# Patient Record
Sex: Female | Born: 1990
Health system: Southern US, Community
[De-identification: ages and names within clinical notes are randomized; demographics above are authoritative.]

## PROBLEM LIST (undated history)

## (undated) DIAGNOSIS — D649 Anemia, unspecified: Secondary | ICD-10-CM

## (undated) DIAGNOSIS — K219 Gastro-esophageal reflux disease without esophagitis: Secondary | ICD-10-CM

## (undated) DIAGNOSIS — F419 Anxiety disorder, unspecified: Secondary | ICD-10-CM

## (undated) DIAGNOSIS — F32A Depression, unspecified: Secondary | ICD-10-CM

## (undated) DIAGNOSIS — Z87442 Personal history of urinary calculi: Secondary | ICD-10-CM

## (undated) DIAGNOSIS — F329 Major depressive disorder, single episode, unspecified: Secondary | ICD-10-CM

## (undated) DIAGNOSIS — G43909 Migraine, unspecified, not intractable, without status migrainosus: Secondary | ICD-10-CM

## (undated) HISTORY — DX: Depression, unspecified: F32.A

## (undated) HISTORY — DX: Anxiety disorder, unspecified: F41.9

---
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
## 2011-10-09 DIAGNOSIS — B009 Herpesviral infection, unspecified: Secondary | ICD-10-CM | POA: Insufficient documentation

## 2012-03-26 ENCOUNTER — Emergency Department (HOSPITAL_COMMUNITY)
Admission: EM | Admit: 2012-03-26 | Discharge: 2012-03-26 | Discharge status: Absent without leave | Payer: Federal, State, Local not specified - PPO | Source: Home / Self Care | Attending: Emergency Medicine | Admitting: Emergency Medicine

## 2012-07-13 ENCOUNTER — Emergency Department (HOSPITAL_COMMUNITY)
Admission: EM | Admit: 2012-07-13 | Discharge: 2012-07-13 | Disposition: A | Discharge status: Home / Self Care | Payer: Federal, State, Local not specified - PPO | Attending: Emergency Medicine | Admitting: Emergency Medicine

## 2012-07-13 ENCOUNTER — Encounter (HOSPITAL_COMMUNITY): Payer: Self-pay | Admitting: Emergency Medicine

## 2012-07-13 DIAGNOSIS — B9789 Other viral agents as the cause of diseases classified elsewhere: Secondary | ICD-10-CM | POA: Insufficient documentation

## 2012-07-13 DIAGNOSIS — R6883 Chills (without fever): Secondary | ICD-10-CM | POA: Insufficient documentation

## 2012-07-13 DIAGNOSIS — R300 Dysuria: Secondary | ICD-10-CM | POA: Insufficient documentation

## 2012-07-13 DIAGNOSIS — R059 Cough, unspecified: Secondary | ICD-10-CM | POA: Insufficient documentation

## 2012-07-13 DIAGNOSIS — R05 Cough: Secondary | ICD-10-CM | POA: Insufficient documentation

## 2012-07-13 DIAGNOSIS — B349 Viral infection, unspecified: Secondary | ICD-10-CM

## 2012-07-13 DIAGNOSIS — H9209 Otalgia, unspecified ear: Secondary | ICD-10-CM | POA: Insufficient documentation

## 2012-07-13 DIAGNOSIS — R61 Generalized hyperhidrosis: Secondary | ICD-10-CM | POA: Insufficient documentation

## 2012-07-13 DIAGNOSIS — J029 Acute pharyngitis, unspecified: Secondary | ICD-10-CM

## 2012-07-13 DIAGNOSIS — J3489 Other specified disorders of nose and nasal sinuses: Secondary | ICD-10-CM | POA: Insufficient documentation

## 2012-07-13 DIAGNOSIS — R1310 Dysphagia, unspecified: Secondary | ICD-10-CM | POA: Insufficient documentation

## 2012-07-13 DIAGNOSIS — J069 Acute upper respiratory infection, unspecified: Secondary | ICD-10-CM | POA: Insufficient documentation

## 2012-07-13 DIAGNOSIS — R070 Pain in throat: Secondary | ICD-10-CM | POA: Insufficient documentation

## 2012-07-13 MED ORDER — DYCLONINE HCL 3 MG MT LOZG: 1.0000 | LOZENGE | OROMUCOSAL | Status: DC | PRN

## 2012-07-13 MED ORDER — BENZOCAINE-MENTHOL 15-10 MG MT LOZG: 1.0000 | LOZENGE | OROMUCOSAL | Status: DC | PRN

## 2012-07-13 MED ORDER — IBUPROFEN 800 MG PO TABS: 800.0000 mg | ORAL_TABLET | Freq: Three times a day (TID) | ORAL | Status: DC

## 2012-07-13 MED ORDER — HYDROCODONE-ACETAMINOPHEN 5-325 MG PO TABS: 1.0000 | ORAL_TABLET | Freq: Four times a day (QID) | ORAL | Status: DC | PRN

## 2012-07-13 MED ORDER — NAPROXEN 250 MG PO TABS
500.0000 mg | ORAL_TABLET | Freq: Once | ORAL | Status: AC
  Administered 2012-07-13: 500 mg via ORAL
  Filled 2012-07-13: qty 2

## 2012-07-13 MED ORDER — HYDROCODONE-ACETAMINOPHEN 5-325 MG PO TABS
2.0000 | ORAL_TABLET | Freq: Once | ORAL | Status: AC
  Administered 2012-07-13: 2 via ORAL
  Filled 2012-07-13: qty 2

## 2012-07-13 MED ORDER — DEXAMETHASONE 2 MG PO TABS
10.0000 mg | ORAL_TABLET | Freq: Once | ORAL | Status: AC
  Administered 2012-07-13: 10 mg via ORAL
  Filled 2012-07-13: qty 5

## 2012-07-13 NOTE — ED Provider Notes (Signed)
History   This chart was scribed for Jones Skene, MD by Melba Coon, ED Scribe. The patient was seen in room TR08C/TR08C and the patient's care was started at 7:29PM.    CSN: [id number]  Arrival date & time 07/13/12  1904   None     Chief Complaint  Patient presents with  . Sore Throat    (Consider location/radiation/quality/duration/timing/severity/associated sxs/prior treatment) The history is provided by the patient. No language interpreter was used.   Michaela Crane is a 21 y.o. female who presents to the Emergency Department complaining of persistent, moderate to severe sore throat with associated fever and difficulty and pain swallowing with an onset 4 days ago. She reprots that her left throat started to hurt first but spread to her entire throat; she reports it hurts worse on her left than her right. She has difficulty swallowing fluids, including her saliva. Ibuprofen and tylenol have slightly alleviated the symptoms but she reports that these meds are not working anymore. She reports she was here at the ED last night and symptoms have gotten progressively worse; no abx were given last night. She reports night sweats, chills, bilateral otalgia, nasal congestion, rhinorrhea, and cough that has alleviated over time. She denies HA, neck pain, rash, back pain, CP, SOB, abdominal pain, nausea, emesis, diarrhea, dysuria, or extremity pain, edema, weakness, numbness, or tingling. She has a history of strep throat in the past. No known allergies. No other pertinent medical symptoms.  History reviewed. No pertinent past medical history.  History reviewed. No pertinent past surgical history.  No family history on file.  History  Substance Use Topics  . Smoking status: Never Smoker   . Smokeless tobacco: Not on file  . Alcohol Use: Yes     Comment: social, once a month    OB History    Grav Para Term Preterm Abortions TAB SAB Ect Mult Living                  Review of  Systems 10 Systems reviewed and are negative for acute change except as noted in the HPI.  Allergies  Review of patient's allergies indicates no known allergies.  Home Medications   Current Outpatient Rx  Name  Route  Sig  Dispense  Refill  . IBUPROFEN 200 MG PO TABS   Oral   Take 1,000 mg by mouth every 8 (eight) hours as needed. For pain           BP 117/69  Pulse 83  Temp 100 F (37.8 C) (Oral)  Resp 20  SpO2 97%  Physical Exam  Nursing notes reviewed.  Electronic medical record reviewed. VITAL SIGNS:   Filed Vitals:   07/13/12 1910  BP: 117/69  Pulse: 83  Temp: 100 F (37.8 C)  TempSrc: Oral  Resp: 20  SpO2: 97%   CONSTITUTIONAL: Awake, oriented, appears non-toxic HENT: Atraumatic, normocephalic, oral mucosa pink and moist, airway patent. Nares patent, turbinates erythematous and swollen with clear nasal drainage. Pharynx shows 3+ tonsils with a gray exudate and copious amounts of mucus in the pharynx draining from the nasopharynx. External ears normal. EYES: Conjunctiva clear, EOMI, PERRLA NECK: Trachea midline, tender cervical lymphadenopathy worse on the left, supple CARDIOVASCULAR: Normal heart rate, Normal rhythm, No murmurs, rubs, gallops PULMONARY/CHEST: Clear to auscultation, no rhonchi, wheezes, or rales. Symmetrical breath sounds. Non-tender. ABDOMINAL: Non-distended, soft, morbidly obese, non-tender - no rebound or guarding.  BS normal. NEUROLOGIC: Non-focal, moving all four extremities, no gross sensory or motor  deficits. EXTREMITIES: No clubbing, cyanosis, or edema SKIN: Warm, Dry, No erythema, No rash  ED Course  Procedures (including critical care time)  COORDINATION OF CARE:  7:37PM - rapid strep screen, pain meds, and cetacaine spray will be ordered for Michaela Crane.     Labs Reviewed  RAPID STREP SCREEN   No results found.   1. Viral pharyngitis   2. Viral syndrome   3. Throat pain   4. Pain with swallowing       MDM   Michaela Crane is a 21 y.o. female presenting with sore throat difficulty swallowing which has gotten worse-she seen in the ER yesterday and diagnosed with viral illness. She had a rapid strep yesterday and a rapid strep today, certainly has an impressive case of viral pharyngitis.  Given the patient some pain medicine including Vicodin and Aleve and is having a much easier time swallowing. Treat the patient with 10 mg of Decadron and send her home with same pain medicine. I still do not think that this is a bacterial source, there is no evidence for peritonsillar abscess or retropharyngeal abscess-I. do not feel that imaging would add anything to the diagnosis at this time.  I explained the diagnosis and have given explicit precautions to return to the ER including any other new or worsening symptoms. The patient understands and accepts the medical plan as it's been dictated and I have answered their questions. Discharge instructions concerning home care and prescriptions have been given.  The patient is STABLE and is discharged to home in good condition.   I personally performed the services described in this documentation, which was scribed in my presence. The recorded information has been reviewed and is accurate.         Jones Skene, MD 07/13/12 2118

## 2012-07-13 NOTE — ED Provider Notes (Signed)
History     CSN: [id number]  Arrival date & time 07/13/12  0303   First MD Initiated Contact with Patient 07/13/12 [phone]      Chief Complaint  Patient presents with  . Sore Throat    (Consider location/radiation/quality/duration/timing/severity/associated sxs/prior treatment) Patient is a 21 y.o. female presenting with pharyngitis. The history is provided by the patient.  Sore Throat Pertinent negatives include no chest pain, no abdominal pain, no headaches and no shortness of breath.   Patient is a 21 year old female with complaint of sore throat for 3 days associated with nonproductive cough congestion upper respiratory type symptoms. No history of strep throat in the past. Denies fever.  Patient treated herself with Motrin and Tylenol cold and flu.patient states his sore throat is 8/10 painful to swallow. History reviewed. No pertinent past medical history.  History reviewed. No pertinent past surgical history.  No family history on file.  History  Substance Use Topics  . Smoking status: Never Smoker   . Smokeless tobacco: Not on file  . Alcohol Use: Yes     Comment: social, once a month    OB History    Grav Para Term Preterm Abortions TAB SAB Ect Mult Living                  Review of Systems  Constitutional: Negative for fever.  HENT: Positive for congestion and sore throat.   Respiratory: Positive for cough. Negative for shortness of breath.   Cardiovascular: Negative for chest pain.  Gastrointestinal: Negative for abdominal pain.  Genitourinary: Positive for dysuria.  Musculoskeletal: Negative for back pain.  Skin: Negative for rash.  Neurological: Negative for headaches.  Hematological: Does not bruise/bleed easily.    Allergies  Review of patient's allergies indicates no known allergies.  Home Medications   Current Outpatient Rx  Name  Route  Sig  Dispense  Refill  . IBUPROFEN 200 MG PO TABS   Oral   Take 1,000 mg by mouth every 8 (eight) hours  as needed. For pain         . TYLENOL COLD MULTI-SYMPTOM PO   Oral   Take 2 tablets by mouth every 6 (six) hours as needed. For cold relief         . IBUPROFEN 800 MG PO TABS   Oral   Take 1 tablet (800 mg total) by mouth 3 (three) times daily.   21 tablet   0     BP 113/72  Pulse 101  Temp 99.2 F (37.3 C) (Oral)  Resp 18  SpO2 98%  Physical Exam  Nursing note and vitals reviewed. Constitutional: She is oriented to person, place, and time. She appears well-developed and well-nourished. No distress.  HENT:  Head: Normocephalic and atraumatic.  Mouth/Throat: Oropharynx is clear and moist.       Tonsils slightly enlarged no exudate erythematous  Eyes: Conjunctivae normal and EOM are normal. Pupils are equal, round, and reactive to light.  Neck: Normal range of motion. Neck supple.  Cardiovascular: Normal rate, regular rhythm and normal heart sounds.   No murmur heard. Pulmonary/Chest: Effort normal and breath sounds normal. No respiratory distress. She has no wheezes.  Abdominal: Soft. Bowel sounds are normal. There is no tenderness.  Musculoskeletal: Normal range of motion. She exhibits no tenderness.  Lymphadenopathy:    She has no cervical adenopathy.  Neurological: She is alert and oriented to person, place, and time. No cranial nerve deficit. She exhibits normal muscle tone. Coordination normal.  Skin: Skin is warm. No rash noted.    ED Course  Procedures (including critical care time)   Labs Reviewed  RAPID STREP SCREEN   No results found.   1. Upper respiratory infection   2. Pharyngitis       MDM   Symptoms and clinical findings are consistent with a viral upper respiratory infection with pharyngitis. strep test is negative.  No symptoms consistent with pneumonia.        Shelda Jakes, MD 07/13/12 [phone]

## 2012-07-13 NOTE — ED Notes (Signed)
C/o sore throat and difficulty swallowing since Thursday.  Pt states she was seen in ED yesterday and is worse today.  Pt spitting saliva out and states she cannot swallow it.

## 2012-07-13 NOTE — ED Notes (Signed)
Pt is unable to swallow. C/O pain in ears, chills, and headaches. Symptoms started Thursday. Pt has been taking tylenol cold and flu and Motrin.

## 2013-02-28 ENCOUNTER — Encounter (HOSPITAL_COMMUNITY): Payer: Self-pay | Admitting: Emergency Medicine

## 2013-02-28 ENCOUNTER — Emergency Department (HOSPITAL_COMMUNITY)
Admission: EM | Admit: 2013-02-28 | Discharge: 2013-03-01 | Disposition: A | Discharge status: Home / Self Care | Payer: Federal, State, Local not specified - PPO | Attending: Emergency Medicine | Admitting: Emergency Medicine

## 2013-02-28 DIAGNOSIS — R109 Unspecified abdominal pain: Secondary | ICD-10-CM | POA: Insufficient documentation

## 2013-02-28 DIAGNOSIS — K529 Noninfective gastroenteritis and colitis, unspecified: Secondary | ICD-10-CM

## 2013-02-28 DIAGNOSIS — Z3202 Encounter for pregnancy test, result negative: Secondary | ICD-10-CM | POA: Insufficient documentation

## 2013-02-28 DIAGNOSIS — K5289 Other specified noninfective gastroenteritis and colitis: Secondary | ICD-10-CM | POA: Insufficient documentation

## 2013-02-28 DIAGNOSIS — R112 Nausea with vomiting, unspecified: Secondary | ICD-10-CM | POA: Insufficient documentation

## 2013-02-28 LAB — CG4 I-STAT (LACTIC ACID): Lactic Acid, Venous: 0.95 mmol/L (ref 0.5–2.2)

## 2013-02-28 LAB — CBC WITH DIFFERENTIAL/PLATELET
Eosinophils Relative: 0 % (ref 0–5)
HCT: 38.9 % (ref 36.0–46.0)
Lymphocytes Relative: 10 % — ABNORMAL LOW (ref 12–46)
Lymphs Abs: 1.9 10*3/uL (ref 0.7–4.0)
MCV: 85.3 fL (ref 78.0–100.0)
Monocytes Absolute: 1.1 10*3/uL — ABNORMAL HIGH (ref 0.1–1.0)
RBC: 4.56 MIL/uL (ref 3.87–5.11)
WBC: 18.5 10*3/uL — ABNORMAL HIGH (ref 4.0–10.5)

## 2013-02-28 LAB — COMPREHENSIVE METABOLIC PANEL
ALT: 11 U/L (ref 0–35)
CO2: 26 mEq/L (ref 19–32)
Calcium: 9.3 mg/dL (ref 8.4–10.5)
Creatinine, Ser: 0.67 mg/dL (ref 0.50–1.10)
GFR calc Af Amer: >90 mL/min (ref >90)
GFR calc non Af Amer: >90 mL/min (ref >90)
Glucose, Bld: 99 mg/dL (ref 70–99)

## 2013-02-28 LAB — URINALYSIS, ROUTINE W REFLEX MICROSCOPIC
Bilirubin Urine: NEGATIVE
Nitrite: NEGATIVE
Protein, ur: 30 mg/dL — AB
Specific Gravity, Urine: 1.028 (ref 1.005–1.030)
Urobilinogen, UA: 0.2 mg/dL (ref 0.0–1.0)

## 2013-02-28 LAB — URINE MICROSCOPIC-ADD ON

## 2013-02-28 MED ORDER — ONDANSETRON HCL 4 MG/2ML IJ SOLN
4.0000 mg | Freq: Once | INTRAMUSCULAR | Status: AC
  Administered 2013-02-28: 4 mg via INTRAVENOUS
  Filled 2013-02-28: qty 2

## 2013-02-28 MED ORDER — SODIUM CHLORIDE 0.9 % IV BOLUS (SEPSIS)
1000.0000 mL | Freq: Once | INTRAVENOUS | Status: AC
  Administered 2013-02-28: 1000 mL via INTRAVENOUS

## 2013-02-28 NOTE — ED Provider Notes (Signed)
CSN: [id number]     Arrival date & time 02/28/13  2121 History     First MD Initiated Contact with Patient 02/28/13 2245     Chief Complaint  Patient presents with  . Emesis  . Diarrhea   (Consider location/radiation/quality/duration/timing/severity/associated sxs/prior Treatment) HPI  The patient is a 22 year old female presented to the emergency department for one day of nausea, nonbilious nonbloody vomiting, diarrhea with occasional mild cramping abdominal pain. Patient states she's not currently having any pain at this time. She denies any alleviating or aggravating factors. She denies any sick contacts or history of abdominal surgeries. LMP was 1 week ago. Patient denies any fevers, chills, dysuria, vaginal discharge or bleeding, cough.  History reviewed. No pertinent past medical history. History reviewed. No pertinent past surgical history. No family history on file. History  Substance Use Topics  . Smoking status: Never Smoker   . Smokeless tobacco: Not on file  . Alcohol Use: Yes     Comment: social, once a month   OB History   Grav Para Term Preterm Abortions TAB SAB Ect Mult Living                 Review of Systems  Constitutional: Negative for fever and chills.  Gastrointestinal: Positive for nausea, vomiting, abdominal pain and diarrhea. Negative for blood in stool, abdominal distention and anal bleeding.  All other systems reviewed and are negative.    Allergies  Review of patient's allergies indicates no known allergies.  Home Medications   Current Outpatient Rx  Name  Route  Sig  Dispense  Refill  . ibuprofen (ADVIL,MOTRIN) 200 MG tablet   Oral   Take 400 mg by mouth every 6 (six) hours as needed for pain.         Marland Kitchen ondansetron (ZOFRAN) 4 MG tablet   Oral   Take 1 tablet (4 mg total) by mouth every 6 (six) hours.   12 tablet   0    BP 120/57  Pulse 76  Temp(Src) 99.6 F (37.6 C) (Oral)  Resp 18  SpO2 97%  LMP 02/20/2013 Physical Exam   Constitutional: She is oriented to person, place, and time. She appears well-developed and well-nourished.  HENT:  Head: Normocephalic and atraumatic.  Mouth/Throat: Oropharynx is clear and moist.  Eyes: Conjunctivae are normal.  Neck: Neck supple.  Cardiovascular: Normal rate, regular rhythm and normal heart sounds.   Pulmonary/Chest: Effort normal and breath sounds normal.  Abdominal: Soft. Bowel sounds are normal. She exhibits no distension. There is no tenderness. There is no rigidity, no rebound, no guarding, no CVA tenderness, no tenderness at McBurney's point and negative Murphy's sign.  Musculoskeletal: Normal range of motion.  Neurological: She is alert and oriented to person, place, and time.  Skin: Skin is warm and dry.    ED Course   Procedures (including critical care time)  Labs Reviewed  CBC WITH DIFFERENTIAL - Abnormal; Notable for the following:    WBC 18.5 (*)    Neutrophils Relative % 84 (*)    Neutro Abs 15.4 (*)    Lymphocytes Relative 10 (*)    Monocytes Absolute 1.1 (*)    All other components within normal limits  COMPREHENSIVE METABOLIC PANEL - Abnormal; Notable for the following:    Total Bilirubin 0.2 (*)    All other components within normal limits  URINALYSIS, ROUTINE W REFLEX MICROSCOPIC - Abnormal; Notable for the following:    Protein, ur 30 (*)    All other  components within normal limits  URINE MICROSCOPIC-ADD ON - Abnormal; Notable for the following:    Squamous Epithelial / LPF FEW (*)    Bacteria, UA FEW (*)    All other components within normal limits  URINE CULTURE  POCT PREGNANCY, URINE  CG4 I-STAT (LACTIC ACID)   No results found. 1. Gastroenteritis     MDM  Patient with symptoms consistent with viral gastroenteritis. Lungs are clear. Abdomen is soft, non-tender, non-distended.  No focal abdominal pain, no concern for appendicitis, cholecystitis, pancreatitis, ruptured viscus, UTI, kidney stone, or any other abdominal etiology.   Vitals are stable, no fever.  Leukocytosis most likely d/t stress. No signs of dehydration. IVF and nausea medication provided. Pt symptoms improved in ED.  Supportive therapy indicated with return if symptoms worsen.  Patient counseled. Patient is agreeable to plan. Patient d/w with Dr. Norlene Campbell, agrees with plan. Patient is stable at time of discharge      Jeannetta Ellis, PA-C 03/01/13 5621

## 2013-02-28 NOTE — ED Notes (Signed)
PT. REPORTS EMESIS WITH DIARRHEA AND LOW ABDOMINAL PAIN ONSET THIS MORNING , DENIES FEVER OR CHILL S, NO DYSURIA OR VAGINAL DISCHARGE.

## 2013-02-28 NOTE — ED Notes (Signed)
Pt reporting 6/10 lower abdominal pain that started this morning. Pt concerned that she could be pregnant. Denies dysuria, vaginal discharge.

## 2013-03-01 MED ORDER — ONDANSETRON HCL 4 MG PO TABS: 4.0000 mg | ORAL_TABLET | Freq: Four times a day (QID) | ORAL | Status: DC

## 2013-03-01 NOTE — ED Provider Notes (Signed)
Medical screening examination/treatment/procedure(s) were performed by non-physician practitioner and as supervising physician I was immediately available for consultation/collaboration.  Blong Busk M Orvella Digiulio, MD 03/01/13 0713 

## 2013-03-02 LAB — URINE CULTURE

## 2014-05-24 ENCOUNTER — Emergency Department (HOSPITAL_BASED_OUTPATIENT_CLINIC_OR_DEPARTMENT_OTHER): Payer: Federal, State, Local not specified - PPO

## 2014-05-24 ENCOUNTER — Encounter (HOSPITAL_BASED_OUTPATIENT_CLINIC_OR_DEPARTMENT_OTHER): Payer: Self-pay | Admitting: Emergency Medicine

## 2014-05-24 ENCOUNTER — Emergency Department (HOSPITAL_BASED_OUTPATIENT_CLINIC_OR_DEPARTMENT_OTHER)
Admission: EM | Admit: 2014-05-24 | Discharge: 2014-05-24 | Disposition: A | Discharge status: Home / Self Care | Payer: Federal, State, Local not specified - PPO | Attending: Emergency Medicine | Admitting: Emergency Medicine

## 2014-05-24 DIAGNOSIS — Y9389 Activity, other specified: Secondary | ICD-10-CM | POA: Insufficient documentation

## 2014-05-24 DIAGNOSIS — S93602A Unspecified sprain of left foot, initial encounter: Secondary | ICD-10-CM

## 2014-05-24 DIAGNOSIS — T1490XA Injury, unspecified, initial encounter: Secondary | ICD-10-CM

## 2014-05-24 DIAGNOSIS — Y929 Unspecified place or not applicable: Secondary | ICD-10-CM | POA: Diagnosis not present

## 2014-05-24 DIAGNOSIS — W1839XA Other fall on same level, initial encounter: Secondary | ICD-10-CM | POA: Diagnosis not present

## 2014-05-24 DIAGNOSIS — Z791 Long term (current) use of non-steroidal anti-inflammatories (NSAID): Secondary | ICD-10-CM | POA: Insufficient documentation

## 2014-05-24 DIAGNOSIS — S99922A Unspecified injury of left foot, initial encounter: Secondary | ICD-10-CM | POA: Diagnosis present

## 2014-05-24 NOTE — ED Notes (Signed)
Left foot injury from a fall 1 week ago

## 2014-05-24 NOTE — Discharge Instructions (Signed)
Foot Sprain The muscles and cord like structures which attach muscle to bone (tendons) that surround the feet are made up of units. A foot sprain can occur at the weakest spot in any of these units. This condition is most often caused by injury to or overuse of the foot, as from playing contact sports, or aggravating a previous injury, or from poor conditioning, or obesity. SYMPTOMS  Pain with movement of the foot.  Tenderness and swelling at the injury site.  Loss of strength is present in moderate or severe sprains. THE THREE GRADES OR SEVERITY OF FOOT SPRAIN ARE:  Mild (Grade I): Slightly pulled muscle without tearing of muscle or tendon fibers or loss of strength.  Moderate (Grade II): Tearing of fibers in a muscle, tendon, or at the attachment to bone, with small decrease in strength.  Severe (Grade III): Rupture of the muscle-tendon-bone attachment, with separation of fibers. Severe sprain requires surgical repair. Often repeating (chronic) sprains are caused by overuse. Sudden (acute) sprains are caused by direct injury or over-use. DIAGNOSIS  Diagnosis of this condition is usually by your own observation. If problems continue, a caregiver may be required for further evaluation and treatment. X-rays may be required to make sure there are not breaks in the bones (fractures) present. Continued problems may require physical therapy for treatment. PREVENTION  Use strength and conditioning exercises appropriate for your sport.  Warm up properly prior to working out.  Use athletic shoes that are made for the sport you are participating in.  Allow adequate time for healing. Early return to activities makes repeat injury more likely, and can lead to an unstable arthritic foot that can result in prolonged disability. Mild sprains generally heal in 3 to 10 days, with moderate and severe sprains taking 2 to 10 weeks. Your caregiver can help you determine the proper time required for  healing. HOME CARE INSTRUCTIONS   Apply ice to the injury for 15-20 minutes, 03-04 times per day. Put the ice in a plastic bag and place a towel between the bag of ice and your skin.  An elastic wrap (like an Ace bandage) may be used to keep swelling down.  Keep foot above the level of the heart, or at least raised on a footstool, when swelling and pain are present.  Try to avoid use other than gentle range of motion while the foot is painful. Do not resume use until instructed by your caregiver. Then begin use gradually, not increasing use to the point of pain. If pain does develop, decrease use and continue the above measures, gradually increasing activities that do not cause discomfort, until you gradually achieve normal use.  Use crutches if and as instructed, and for the length of time instructed.  Keep injured foot and ankle wrapped between treatments.  Massage foot and ankle for comfort and to keep swelling down. Massage from the toes up towards the knee.  Only take over-the-counter or prescription medicines for pain, discomfort, or fever as directed by your caregiver. SEEK IMMEDIATE MEDICAL CARE IF:   Your pain and swelling increase, or pain is not controlled with medications.  You have loss of feeling in your foot or your foot turns cold or blue.  You develop new, unexplained symptoms, or an increase of the symptoms that brought you to your caregiver. MAKE SURE YOU:   Understand these instructions.  Will watch your condition.  Will get help right away if you are not doing well or get worse. Document Released:   01/12/2002 Document Revised: 10/15/2011 Document Reviewed: 03/11/2008 Exit[employer] Patient Information 2015 [employer]. This information is not intended to replace advice given to you by your health care provider. Make sure you discuss any questions you have with your health care provider.  

## 2014-05-24 NOTE — ED Provider Notes (Signed)
CSN: [id number]     Arrival date & time 05/24/14  1531 History   First MD Initiated Contact with Patient 05/24/14 1714     Chief Complaint  Patient presents with  . Foot Injury     (Consider location/radiation/quality/duration/timing/severity/associated sxs/prior Treatment) HPI Comments: Pt states that she just wants to make sure that it isn't broken  Patient is a 23 y.o. female presenting with foot injury. The history is provided by the patient. No language interpreter was used.  Foot Injury Location:  Foot Time since incident:  1 week Injury: yes   Mechanism of injury: fall   Fall:    Fall occurred:  Standing   Impact surface:  Hard floor   Entrapped after fall: no   Foot location:  L foot Pain details:    Quality:  Aching   Radiates to:  Does not radiate   Severity:  Mild   Onset quality:  Sudden   Timing:  Constant   Progression:  Waxing and waning   History reviewed. No pertinent past medical history. History reviewed. No pertinent past surgical history. No family history on file. History  Substance Use Topics  . Smoking status: Never Smoker   . Smokeless tobacco: Not on file  . Alcohol Use: Yes   OB History   Grav Para Term Preterm Abortions TAB SAB Ect Mult Living                 Review of Systems  All other systems reviewed and are negative.     Allergies  Review of patient's allergies indicates no known allergies.  Home Medications   Prior to Admission medications   Medication Sig Start Date End Date Taking? Authorizing Provider  ibuprofen (ADVIL,MOTRIN) 200 MG tablet Take 400 mg by mouth every 6 (six) hours as needed for pain.    Historical Provider, MD  ondansetron (ZOFRAN) 4 MG tablet Take 1 tablet (4 mg total) by mouth every 6 (six) hours. 03/01/13   Jennifer L Piepenbrink, PA-C   BP 126/64  Pulse 62  Temp(Src) 98.6 F (37 C) (Oral)  Resp 16  Ht 5\' 2"  (1.575 m)  Wt 220 lb (99.791 kg)  BMI 40.23 kg/m2  SpO2 99%  LMP  05/16/2014 Physical Exam  Nursing note and vitals reviewed. Constitutional: She is oriented to person, place, and time. She appears well-developed and well-nourished.  Cardiovascular: Normal rate and regular rhythm.   Pulmonary/Chest: Effort normal and breath sounds normal.  Musculoskeletal:  Tender along the fourth and fifth metatarsal. No deformity or swelling. Pulses intact.   Neurological: She is alert and oriented to person, place, and time.    ED Course  Procedures (including critical care time) Labs Review Labs Reviewed - No data to display  Imaging Review Dg Foot Complete Left  05/24/2014   CLINICAL DATA:  Left lateral foot pain after fall.  EXAM: LEFT FOOT - COMPLETE 3+ VIEW  COMPARISON:  None.  FINDINGS: There is no evidence of fracture or dislocation. There is no evidence of arthropathy or other focal bone abnormality. Soft tissues are unremarkable.  IMPRESSION: Normal left foot.   Electronically Signed   By: Roque LiasJames  Green M.D.   On: 05/24/2014 16:22     EKG Interpretation None      MDM   Final diagnoses:  Foot sprain, left, initial encounter    No acute bony abnormality needed. Pt given ortho follow up as needed. Discussed stress fractures    Teressa LowerVrinda Vinny Taranto, NP 05/24/14 1733

## 2014-05-26 NOTE — ED Provider Notes (Signed)
Medical screening examination/treatment/procedure(s) were performed by non-physician practitioner and as supervising physician I was immediately available for consultation/collaboration.   EKG Interpretation None        Kendrea Cerritos, MD 05/26/14 0010 

## 2014-08-12 ENCOUNTER — Emergency Department (HOSPITAL_BASED_OUTPATIENT_CLINIC_OR_DEPARTMENT_OTHER)
Admission: EM | Admit: 2014-08-12 | Discharge: 2014-08-12 | Disposition: A | Discharge status: Home / Self Care | Payer: Federal, State, Local not specified - PPO | Attending: Emergency Medicine | Admitting: Emergency Medicine

## 2014-08-12 ENCOUNTER — Encounter (HOSPITAL_BASED_OUTPATIENT_CLINIC_OR_DEPARTMENT_OTHER): Payer: Self-pay | Admitting: Family Medicine

## 2014-08-12 DIAGNOSIS — J029 Acute pharyngitis, unspecified: Secondary | ICD-10-CM | POA: Diagnosis present

## 2014-08-12 DIAGNOSIS — J0100 Acute maxillary sinusitis, unspecified: Secondary | ICD-10-CM

## 2014-08-12 LAB — RAPID STREP SCREEN (MED CTR MEBANE ONLY): STREPTOCOCCUS, GROUP A SCREEN (DIRECT): NEGATIVE

## 2014-08-12 MED ORDER — BENZONATATE 100 MG PO CAPS: 100.0000 mg | ORAL_CAPSULE | Freq: Three times a day (TID) | ORAL | Status: DC

## 2014-08-12 MED ORDER — AMOXICILLIN-POT CLAVULANATE 875-125 MG PO TABS: 1.0000 | ORAL_TABLET | Freq: Two times a day (BID) | ORAL | Status: DC

## 2014-08-12 NOTE — ED Provider Notes (Signed)
CSN: [id number]     Arrival date & time 08/12/14  1605 History   First MD Initiated Contact with Patient 08/12/14 1809     Chief Complaint  Patient presents with  . Sore Throat  . Generalized Body Aches     (Consider location/radiation/quality/duration/timing/severity/associated sxs/prior Treatment) Patient is a 24 y.o. female presenting with pharyngitis. The history is provided by the patient. No language interpreter was used.  Sore Throat This is a new problem. The current episode started in the past 7 days. The problem occurs constantly. The problem has been gradually worsening. Associated symptoms include coughing and a sore throat. Nothing aggravates the symptoms. She has tried nothing for the symptoms. The treatment provided moderate relief.  Pt complains of a cough and sore throat.  Pt reports she has been sick with the same 3 times in 6 weeks.  Pt reports symptoms keep coming back  History reviewed. No pertinent past medical history. History reviewed. No pertinent past surgical history. No family history on file. History  Substance Use Topics  . Smoking status: Never Smoker   . Smokeless tobacco: Not on file  . Alcohol Use: Yes   OB History    No data available     Review of Systems  HENT: Positive for sore throat.   Respiratory: Positive for cough.   All other systems reviewed and are negative.     Allergies  Review of patient's allergies indicates no known allergies.  Home Medications   Prior to Admission medications   Medication Sig Start Date End Date Taking? Authorizing Provider  amoxicillin-clavulanate (AUGMENTIN) 875-125 MG per tablet Take 1 tablet by mouth every 12 (twelve) hours. 08/12/14   Elson Areas, PA-C  benzonatate (TESSALON) 100 MG capsule Take 1 capsule (100 mg total) by mouth every 8 (eight) hours. 08/12/14   Elson Areas, PA-C  ibuprofen (ADVIL,MOTRIN) 200 MG tablet Take 400 mg by mouth every 6 (six) hours as needed for pain.    Historical  Provider, MD  ondansetron (ZOFRAN) 4 MG tablet Take 1 tablet (4 mg total) by mouth every 6 (six) hours. 03/01/13   Jennifer L Piepenbrink, PA-C   BP 120/56 mmHg  Pulse 78  Temp(Src) 97.6 F (36.4 C) (Oral)  Resp 18  Ht  (1.575 m)  Wt 220 lb (99.791 kg)  BMI 40.23 kg/m2  SpO2 100%  LMP 08/02/2014 Physical Exam  Constitutional: She is oriented to person, place, and time. She appears well-developed and well-nourished.  HENT:  Head: Normocephalic and atraumatic.  Right Ear: External ear normal.  Erythema throa  Eyes: EOM are normal. Pupils are equal, round, and reactive to light.  Neck: Normal range of motion.  Cardiovascular: Normal rate and normal heart sounds.   Pulmonary/Chest: Effort normal.  Abdominal: Soft. She exhibits no distension.  Musculoskeletal: Normal range of motion.  Neurological: She is alert and oriented to person, place, and time.  Skin: Skin is warm.  Psychiatric: She has a normal mood and affect.  Nursing note reviewed.   ED Course  Procedures (including critical care time) Labs Review Labs Reviewed  RAPID STREP SCREEN  CULTURE, GROUP A STREP    Imaging Review No results found.   EKG Interpretation None      MDM   Final diagnoses:  Acute maxillary sinusitis, recurrence not specified    Pt counseled on symptoms and viral vs bacterial etiology.   Pt request antibiotic treatment.  I will give tessalon for cough    Lonia Skinner  Joylene GrapesSofia, PA-C 08/12/14 1854  Gerhard Munchobert Lockwood, MD 08/12/14 2337

## 2014-08-12 NOTE — ED Notes (Addendum)
Pt c/o sore throat, cough and generalized body aches x 6 days. Denies fever, n/v/d. Pt sts she has been sick 3x in past few weeks.

## 2014-08-12 NOTE — Discharge Instructions (Signed)

## 2014-08-14 LAB — CULTURE, GROUP A STREP

## 2015-01-31 ENCOUNTER — Ambulatory Visit (INDEPENDENT_AMBULATORY_CARE_PROVIDER_SITE_OTHER): Payer: Federal, State, Local not specified - PPO | Admitting: Physician Assistant

## 2015-01-31 VITALS — BP 111/74 | HR 83 | Temp 98.5°F | Resp 18 | Ht 62.5 in | Wt 221.4 lb

## 2015-01-31 DIAGNOSIS — N926 Irregular menstruation, unspecified: Secondary | ICD-10-CM

## 2015-01-31 LAB — POCT WET PREP WITH KOH
KOH Prep POC: NEGATIVE
TRICHOMONAS UA: NEGATIVE
Yeast Wet Prep HPF POC: NEGATIVE

## 2015-01-31 LAB — POCT URINE PREGNANCY: Preg Test, Ur: NEGATIVE

## 2015-01-31 NOTE — Patient Instructions (Signed)
Motrin up to 800mg  up to every 8 hours  I will contact you with your lab results as soon as they are available.   If you have not heard from me in 2 weeks, please contact me.  The fastest way to get your results is to register for My Chart (see the instructions on the last page of this printout).

## 2015-01-31 NOTE — Progress Notes (Signed)
   Michaela Crane  MRN: [id number] DOB: [birth year]  Subjective:  Pt presents to clinic with irregular menses and menstrual cramps not associated with her normal menses.  She had a normal menses 2 weeks ago without cramping.  She then developed vaginal discharge which has since resolved and then this week she developed vaginal bleeding that has been dark brown.  She has been taking some tylenol and that helps but she wants to make sure she is ok.  She had her Mirena replaced in 3/15 and she has had not problems with it since its placement.  Sexually active - use condoms every time - no sex in last 2 partner -   There are no active problems to display for this patient.   No current outpatient prescriptions on file prior to visit.   No current facility-administered medications on file prior to visit.    No Known Allergies  Review of Systems  Genitourinary: Positive for vaginal bleeding (irregular menses), vaginal discharge (last week - now resolved) and menstrual problem (irregular).   Objective:  BP 111/74 mmHg  Pulse 83  Temp(Src) 98.5 F (36.9 C) (Oral)  Resp 18  Ht 5' 2.5" (1.588 m)  Wt 221 lb 6 oz (100.415 kg)  BMI 39.82 kg/m2  SpO2 98%  LMP 01/15/2015 (Exact Date)  Physical Exam  Constitutional: She is oriented to person, place, and time and well-developed, well-nourished, and in no distress.  HENT:  Head: Normocephalic and atraumatic.  Right Ear: External ear normal.  Left Ear: External ear normal.  Cardiovascular: Normal rate, regular rhythm and normal heart sounds.   No murmur heard. Pulmonary/Chest: Effort normal and breath sounds normal.  Abdominal: Soft. There is tenderness in the suprapubic area. There is no CVA tenderness.  Genitourinary: Uterus normal, cervix normal, right adnexa normal, left adnexa normal and vulva normal. Brown and vaginal discharge found.  IUD in place Old blood in vaginal vault  Neurological: She is alert and oriented to person, place,  and time. Gait normal.  Skin: Skin is warm and dry.  Psychiatric: Mood, memory, affect and judgment normal.  Vitals reviewed.   Results for orders placed or performed in visit on 01/31/15  POCT urine pregnancy  Result Value Ref Range   Preg Test, Ur Negative Negative  POCT Wet Prep with KOH  Result Value Ref Range   Trichomonas, UA Negative    Clue Cells Wet Prep HPF POC 0-2    Epithelial Wet Prep HPF POC Moderate Few, Moderate, Many   Yeast Wet Prep HPF POC neg    Bacteria Wet Prep HPF POC Moderate (A) Few   RBC Wet Prep HPF POC 4-8    WBC Wet Prep HPF POC 5-10    KOH Prep POC Negative      Assessment and Plan :  Irregular menses - Plan: POCT urine pregnancy, POCT Wet Prep with KOH, GC/Chlamydia Probe Amp   Pt is not pregnant and we are waiting for her genprobe to return.  If this continues we might need to do an [place] to make sure the IUD is in place.  She will use tylenol and motrin to help with the pain.  Benny Lennert PA-C  [facility] 01/31/2015 8:15 PM

## 2015-01-31 NOTE — Progress Notes (Signed)
  Medical screening examination/treatment/procedure(s) were performed by non-physician practitioner and as supervising physician I was immediately available for consultation/collaboration.     

## 2015-02-02 ENCOUNTER — Telehealth: Payer: Self-pay | Admitting: Physician Assistant

## 2015-02-02 ENCOUNTER — Ambulatory Visit (INDEPENDENT_AMBULATORY_CARE_PROVIDER_SITE_OTHER): Payer: Federal, State, Local not specified - PPO | Admitting: Physician Assistant

## 2015-02-02 VITALS — BP 108/64 | HR 60 | Temp 98.0°F | Resp 16 | Ht 63.5 in | Wt 222.0 lb

## 2015-02-02 DIAGNOSIS — H109 Unspecified conjunctivitis: Secondary | ICD-10-CM

## 2015-02-02 LAB — GC/CHLAMYDIA PROBE AMP
CT PROBE, AMP APTIMA: POSITIVE — AB
GC Probe RNA: NEGATIVE

## 2015-02-02 MED ORDER — AZITHROMYCIN 500 MG PO TABS: 1000.0000 mg | ORAL_TABLET | Freq: Every day | ORAL | Status: DC

## 2015-02-02 NOTE — Progress Notes (Signed)
[facility] [address], [place] [address] [phone]  Date:  02/02/2015   Name:  Michaela Crane   DOB:  Dec 04, 1990   MRN:  [id number]  PCP:  No PCP Per Patient    Chief Complaint: Conjunctivitis   History of Present Illness:  This is a 24 y.o. female who is presenting with right eye redness x 1 day. Eye is mildly itchy but not painful. No crusting on eyelid when she woke this morning. She is having some intermittent blurred vision with drainage but otherwise normal. Had clear drainage from eye yesterday but none yet today. She wears contacts. Since symptoms started she has been using just glasses. No URI symptoms prior to this starting. No sick contacts. She works as a LawyerCNA.  Review of Systems:  Review of Systems See HPI.  There are no active problems to display for this patient.  Home meds: None  No Known Allergies  History reviewed. No pertinent past surgical history.  History  Substance Use Topics  . Smoking status: Never Smoker   . Smokeless tobacco: Not on file  . Alcohol Use: Yes    History reviewed. No pertinent family history.  Medication list has been reviewed and updated.  Physical Examination:  Physical Exam  Constitutional: She is oriented to person, place, and time. She appears well-developed and well-nourished. No distress.  HENT:  Head: Normocephalic and atraumatic.  Right Ear: Hearing, external ear and ear canal normal.  Left Ear: Hearing, external ear and ear canal normal.  Nose: Nose normal.  Mouth/Throat: Uvula is midline, oropharynx is clear and moist and mucous membranes are normal.  Bilateral TMs partially obstructed by cerumen  Eyes: EOM and lids are normal. Pupils are equal, round, and reactive to light. Right eye exhibits no discharge. Left eye exhibits no discharge. Right conjunctiva is injected. Left conjunctiva is not injected. No scleral icterus.  No pain with EOM  Cardiovascular: Normal rate, regular  rhythm, normal heart sounds and normal pulses.   No murmur heard. Pulmonary/Chest: Effort normal and breath sounds normal. No respiratory distress. She has no wheezes. She has no rhonchi. She has no rales.  Musculoskeletal: Normal range of motion.  Lymphadenopathy:       Head (right side): No submental, no submandibular and no tonsillar adenopathy present.       Head (left side): No submental, no submandibular and no tonsillar adenopathy present.    She has no cervical adenopathy.  Neurological: She is alert and oriented to person, place, and time.  Skin: Skin is warm, dry and intact. No lesion and no rash noted.  Psychiatric: She has a normal mood and affect. Her speech is normal and behavior is normal. Thought content normal.   BP 108/64 mmHg  Pulse 60  Temp(Src) 98 F (36.7 C)  Resp 16  Ht 5' 3.5" (1.613 m)  Wt 222 lb (100.699 kg)  BMI 38.70 kg/m2  SpO2 98%  LMP 01/15/2015 (Exact Date)   Visual Acuity Screening   Right eye Left eye Both eyes  Without correction:     With correction: 20/20 20/25 20/20     Assessment and Plan:  1. Conjunctivitis of right eye Likely viral in etiology. Counseled on lubricating eye drops, warm compresses and hand hygiene. Return protocol discussed.    Roswell MinersNicole V. Dyke BrackettBush, PA-C, MHS [facility]  02/02/2015

## 2015-02-02 NOTE — Telephone Encounter (Signed)
D/w pt her diagnosis of chlamydia - she will RTC in 1 week for test for cure due to her IUD.  Her partner needs to treatment.

## 2015-02-02 NOTE — Patient Instructions (Signed)
You can apply lubricating/rewetting eye drops but no other drops in eye. Warm compresses can help. Practice good hand hygiene. Return if not getting better in 1 week.  Return if you develop eye pain, vision change, fever or chills.

## 2015-04-05 ENCOUNTER — Telehealth: Payer: Self-pay

## 2015-04-05 NOTE — Telephone Encounter (Signed)
Michaela Crane - Pt wants you and only you to call her back in regards to her last visit/labs.  [phone]

## 2015-04-12 NOTE — Telephone Encounter (Signed)
Left message for pt to call back  °

## 2015-04-12 NOTE — Telephone Encounter (Signed)
PLease let the patient know I will be out until Thursday.  Please see if you can get any information for me please.

## 2015-04-13 ENCOUNTER — Ambulatory Visit (INDEPENDENT_AMBULATORY_CARE_PROVIDER_SITE_OTHER): Payer: Federal, State, Local not specified - PPO | Admitting: Family Medicine

## 2015-04-13 VITALS — BP 118/80 | HR 79 | Temp 98.7°F | Resp 18 | Ht 63.25 in | Wt 230.4 lb

## 2015-04-13 DIAGNOSIS — N939 Abnormal uterine and vaginal bleeding, unspecified: Secondary | ICD-10-CM

## 2015-04-13 DIAGNOSIS — Z975 Presence of (intrauterine) contraceptive device: Secondary | ICD-10-CM | POA: Diagnosis not present

## 2015-04-13 LAB — POCT WET PREP WITH KOH
Clue Cells Wet Prep HPF POC: NEGATIVE
KOH PREP POC: NEGATIVE
TRICHOMONAS UA: NEGATIVE
YEAST WET PREP PER HPF POC: NEGATIVE

## 2015-04-13 LAB — POCT URINE PREGNANCY: PREG TEST UR: NEGATIVE

## 2015-04-13 MED ORDER — IBUPROFEN 800 MG PO TABS: 800.0000 mg | ORAL_TABLET | Freq: Three times a day (TID) | ORAL | Status: DC | PRN

## 2015-04-13 NOTE — Progress Notes (Signed)
Subjective:    Patient ID: Michaela Crane, female    DOB: February 09, 1991, 24 y.o.   MRN: [id number]  04/13/2015  Menstrual Problem   HPI This 24 y.o. female presents for evaluation of ongoing persistent irregular vaginal bleeding.  Mirena IUD placed 10/2014 at [facility].  Having monthly menses with Mirena. Second Mirena for patient; had monthly periods with previous Mirena and then stopped having menses for one year and then menses recurred.  Recurrent bleeding for four days but just finished menses last week for 5 days.  Last month, bled for ten days as well.  +vaginal discharge before started bleeding.  No vaginal odor, itching, or burning.  Sexually active; same old partner.  [facility] department placed IUD in 10/2014.  [facility]  Evaluated in June 2016 for irregular vaginal bleeding.  Tested positive for Chlamydia; treated with Zithromax.  Partner also was tested and treated.     Review of Systems  Constitutional: Negative for fever, chills, diaphoresis and fatigue.  Gastrointestinal: Negative for nausea, vomiting and abdominal pain.  Genitourinary: Positive for vaginal bleeding, vaginal discharge and menstrual problem. Negative for dysuria, flank pain, decreased urine volume, difficulty urinating, genital sores, vaginal pain and dyspareunia.  Skin: Negative for rash.    History reviewed. No pertinent past medical history. History reviewed. No pertinent past surgical history. No Known Allergies  Social History   Social History  . Marital Status: Single    Spouse Name: N/A  . Number of Children: N/A  . Years of Education: N/A   Occupational History  . Not on file.   Social History Main Topics  . Smoking status: Never Smoker   . Smokeless tobacco: Not on file  . Alcohol Use: Yes  . Drug Use: No  . Sexual Activity: Not on file   Other Topics Concern  . Not on file   Social History Narrative   History reviewed. No  pertinent family history.     Objective:    BP 118/80 mmHg  Pulse 79  Temp(Src) 98.7 F (37.1 C) (Oral)  Resp 18  Ht 5' 3.25" (1.607 m)  Wt 230 lb 6.4 oz (104.509 kg)  BMI 40.47 kg/m2  SpO2 98%  LMP 04/06/2015 Physical Exam  Constitutional: She is oriented to person, place, and time. She appears well-developed and well-nourished. No distress.  HENT:  Head: Normocephalic and atraumatic.  Eyes: Conjunctivae are normal. Pupils are equal, round, and reactive to light.  Neck: Normal range of motion. Neck supple.  Cardiovascular: Normal rate, regular rhythm and normal heart sounds.  Exam reveals no gallop and no friction rub.   No murmur heard. Pulmonary/Chest: Effort normal and breath sounds normal. She has no wheezes. She has no rales.  Abdominal: Soft. Bowel sounds are normal. She exhibits no distension and no mass. There is no tenderness. There is no rebound and no guarding.  Genitourinary: Uterus normal. There is no rash, tenderness or lesion on the right labia. There is no rash, tenderness or lesion on the left labia. Cervix exhibits no motion tenderness, no discharge and no friability. Right adnexum displays no mass, no tenderness and no fullness. Left adnexum displays no mass, no tenderness and no fullness. There is bleeding in the vagina. No erythema in the vagina. No foreign body around the vagina. No vaginal discharge found.  IUD strings visualized.  Neurological: She is alert and oriented to person, place, and time.  Skin: She is not diaphoretic.  Psychiatric:  She has a normal mood and affect. Her behavior is normal.  Nursing note and vitals reviewed.  Results for orders placed or performed in visit on 04/13/15  POCT urine pregnancy  Result Value Ref Range   Preg Test, Ur Negative Negative  POCT Wet Prep with KOH  Result Value Ref Range   Trichomonas, UA Negative    Clue Cells Wet Prep HPF POC neg    Epithelial Wet Prep HPF POC Few Few, Moderate, Many   Yeast Wet Prep  HPF POC neg    Bacteria Wet Prep HPF POC Few None, Few   RBC Wet Prep HPF POC 6-8    WBC Wet Prep HPF POC 5-10    KOH Prep POC Negative        Assessment & Plan:   1. Vaginal bleeding   2. Intrauterine contraceptive device status    -Persistent. -Pt declined trial of Doxycycline; agreeable to trial of Ibuprofen 800mg  tid for five days with recurrent bleeding. Advised pt that Ibuprofen less effective for irregular vaginal bleeding with Mirena IUD yet pt not interested in abx for bleeding. -Obtain GC/Chlam to rule out secondary causes of bleeding. -Pt reports pap smear obtained 10/2014 and UTD.  Orders Placed This Encounter  Procedures  . GC/Chlamydia Probe Amp  . POCT urine pregnancy  . POCT Wet Prep with KOH   Meds ordered this encounter  Medications  . ibuprofen (ADVIL,MOTRIN) 800 MG tablet    Sig: Take 1 tablet (800 mg total) by mouth every 8 (eight) hours as needed.    Dispense:  30 tablet    Refill:  0    No Follow-up on file.    Terrin Meddaugh Paulita Fujita, M.D. [facility] [address] [place], [place]  [phone] phone [phone] fax

## 2015-04-13 NOTE — Telephone Encounter (Signed)
Left message for pt to call back  °

## 2015-04-13 NOTE — Telephone Encounter (Signed)
Spoke with pt, she had a question about her lab results. Pt will come in to get re-tested to make sure the infection is gone.

## 2015-04-15 LAB — GC/CHLAMYDIA PROBE AMP
CT PROBE, AMP APTIMA: NEGATIVE
GC PROBE AMP APTIMA: NEGATIVE

## 2015-07-17 ENCOUNTER — Ambulatory Visit (INDEPENDENT_AMBULATORY_CARE_PROVIDER_SITE_OTHER): Payer: Federal, State, Local not specified - PPO | Admitting: Urgent Care

## 2015-07-17 VITALS — BP 116/70 | HR 89 | Temp 98.8°F | Resp 18 | Ht 64.0 in | Wt 228.6 lb

## 2015-07-17 DIAGNOSIS — Z7251 High risk heterosexual behavior: Secondary | ICD-10-CM

## 2015-07-17 DIAGNOSIS — R457 State of emotional shock and stress, unspecified: Secondary | ICD-10-CM | POA: Diagnosis not present

## 2015-07-17 DIAGNOSIS — Z113 Encounter for screening for infections with a predominantly sexual mode of transmission: Secondary | ICD-10-CM | POA: Diagnosis not present

## 2015-07-17 LAB — POCT WET + KOH PREP
TRICH BY WET PREP: ABSENT
YEAST BY KOH: ABSENT
YEAST BY WET PREP: ABSENT

## 2015-07-17 LAB — POCT URINE PREGNANCY: PREG TEST UR: NEGATIVE

## 2015-07-17 NOTE — Patient Instructions (Addendum)
Citalopram tablets What is this medicine? CITALOPRAM (sye TAL oh pram) is a medicine for depression. This medicine may be used for other purposes; ask your health care provider or pharmacist if you have questions. What should I tell my health care provider before I take this medicine? They need to know if you have any of these conditions: -bipolar disorder or a family history of bipolar disorder -diabetes -glaucoma -heart disease -history of irregular heartbeat -kidney or liver disease -low levels of magnesium or potassium in the blood -receiving electroconvulsive therapy -seizures (convulsions) -suicidal thoughts or a previous suicide attempt -an unusual or allergic reaction to citalopram, escitalopram, other medicines, foods, dyes, or preservatives -pregnant or trying to become pregnant -breast-feeding How should I use this medicine? Take this medicine by mouth with a glass of water. Follow the directions on the prescription label. You can take it with or without food. Take your medicine at regular intervals. Do not take your medicine more often than directed. Do not stop taking this medicine suddenly except upon the advice of your doctor. Stopping this medicine too quickly may cause serious side effects or your condition may worsen. A special MedGuide will be given to you by the pharmacist with each prescription and refill. Be sure to read this information carefully each time. Talk to your pediatrician regarding the use of this medicine in children. Special care may be needed. Patients over 60 years old may have a stronger reaction and need a smaller dose. Overdosage: If you think you have taken too much of this medicine contact a poison control center or emergency room at once. NOTE: This medicine is only for you. Do not share this medicine with others. What if I miss a dose? If you miss a dose, take it as soon as you can. If it is almost time for your next dose, take only that dose.  Do not take double or extra doses. What may interact with this medicine? Do not take this medicine with any of the following medications: -certain medicines for fungal infections like fluconazole, itraconazole, ketoconazole, posaconazole, voriconazole -cisapride -dofetilide -dronedarone -escitalopram -linezolid -MAOIs like Carbex, Eldepryl, Marplan, Nardil, and Parnate -methylene blue (injected into a vein) -pimozide -thioridazine -ziprasidone This medicine may also interact with the following medications: -alcohol -aspirin and aspirin-like medicines -carbamazepine -certain medicines for depression, anxiety, or psychotic disturbances -certain medicines for infections like chloroquine, clarithromycin, erythromycin, furazolidone, isoniazid, pentamidine -certain medicines for migraine headaches like almotriptan, eletriptan, frovatriptan, naratriptan, rizatriptan, sumatriptan, zolmitriptan -certain medicines for sleep -certain medicines that treat or prevent blood clots like dalteparin, enoxaparin, warfarin -cimetidine -diuretics -fentanyl -lithium -methadone -metoprolol -NSAIDs, medicines for pain and inflammation, like ibuprofen or naproxen -omeprazole -other medicines that prolong the QT interval (cause an abnormal heart rhythm) -procarbazine -rasagiline -supplements like St. John's wort, kava kava, valerian -tramadol -tryptophan This list may not describe all possible interactions. Give your health care provider a list of all the medicines, herbs, non-prescription drugs, or dietary supplements you use. Also tell them if you smoke, drink alcohol, or use illegal drugs. Some items may interact with your medicine. What should I watch for while using this medicine? Tell your doctor if your symptoms do not get better or if they get worse. Visit your doctor or health care professional for regular checks on your progress. Because it may take several weeks to see the full effects of  this medicine, it is important to continue your treatment as prescribed by your doctor. Patients and their families should watch out   for new or worsening thoughts of suicide or depression. Also watch out for sudden changes in feelings such as feeling anxious, agitated, panicky, irritable, hostile, aggressive, impulsive, severely restless, overly excited and hyperactive, or not being able to sleep. If this happens, especially at the beginning of treatment or after a change in dose, call your health care professional. Michaela Crane may get drowsy or dizzy. Do not drive, use machinery, or do anything that needs mental alertness until you know how this medicine affects you. Do not stand or sit up quickly, especially if you are an older patient. This reduces the risk of dizzy or fainting spells. Alcohol may interfere with the effect of this medicine. Avoid alcoholic drinks. Your mouth may get dry. Chewing sugarless gum or sucking hard candy, and drinking plenty of water will help. Contact your doctor if the problem does not go away or is severe. What side effects may I notice from receiving this medicine? Side effects that you should report to your doctor or health care professional as soon as possible: -allergic reactions like skin rash, itching or hives, swelling of the face, lips, or tongue -chest pain -confusion -dizziness -fast, irregular heartbeat -fast talking and excited feelings or actions that are out of control -feeling faint or lightheaded, falls -hallucination, loss of contact with reality -seizures -shortness of breath -suicidal thoughts or other mood changes -unusual bleeding or bruising Side effects that usually do not require medical attention (report to your doctor or health care professional if they continue or are bothersome): -blurred vision -change in appetite -change in sex drive or performance -headache -increased sweating -nausea -trouble sleeping This list may not describe all  possible side effects. Call your doctor for medical advice about side effects. You may report side effects to FDA at 1-800-FDA-1088. Where should I keep my medicine? Keep out of reach of children. Store at room temperature between 15 and 30 degrees C (59 and 86 degrees F). Throw away any unused medicine after the expiration date. NOTE: This sheet is a summary. It may not cover all possible information. If you have questions about this medicine, talk to your doctor, pharmacist, or health care provider.    2016, Elsevier/Gold Standard. (2013-02-13 13:19:48)    Fluoxetine capsules or tablets (Depression/Mood Disorders) What is this medicine? FLUOXETINE (floo OX e teen) belongs to a class of drugs known as selective serotonin reuptake inhibitors (SSRIs). It helps to treat mood problems such as depression, obsessive compulsive disorder, and panic attacks. It can also treat certain eating disorders. This medicine may be used for other purposes; ask your health care provider or pharmacist if you have questions. What should I tell my health care provider before I take this medicine? They need to know if you have any of these conditions: -bipolar disorder or mania -diabetes -glaucoma -liver disease -psychosis -seizures -suicidal thoughts or history of attempted suicide -an unusual or allergic reaction to fluoxetine, other medicines, foods, dyes, or preservatives -pregnant or trying to get pregnant -breast-feeding How should I use this medicine? Take this medicine by mouth with a glass of water. Follow the directions on the prescription label. You can take this medicine with or without food. Take your medicine at regular intervals. Do not take it more often than directed. Do not stop taking this medicine suddenly except upon the advice of your doctor. Stopping this medicine too quickly may cause serious side effects or your condition may worsen. A special MedGuide will be given to you by the  pharmacist with  each prescription and refill. Be sure to read this information carefully each time. Talk to your pediatrician regarding the use of this medicine in children. While this drug may be prescribed for children as young as 7 years for selected conditions, precautions do apply. Overdosage: If you think you have taken too much of this medicine contact a poison control center or emergency room at once. NOTE: This medicine is only for you. Do not share this medicine with others. What if I miss a dose? If you miss a dose, skip the missed dose and go back to your regular dosing schedule. Do not take double or extra doses. What may interact with this medicine? Do not take fluoxetine with any of the following medications: -other medicines containing fluoxetine, like Sarafem or Symbyax -cisapride -linezolid -MAOIs like Carbex, Eldepryl, Marplan, Nardil, and Parnate -methylene blue (injected into a vein) -pimozide -thioridazine This medicine may also interact with the following medications: -alcohol -aspirin and aspirin-like medicines -carbamazepine -certain medicines for depression, anxiety, or psychotic disturbances -certain medicines for migraine headaches like almotriptan, eletriptan, frovatriptan, naratriptan, rizatriptan, sumatriptan, zolmitriptan -digoxin -diuretics -fentanyl -flecainide -furazolidone -isoniazid -lithium -medicines for sleep -medicines that treat or prevent blood clots like warfarin, enoxaparin, and dalteparin -NSAIDs, medicines for pain and inflammation, like ibuprofen or naproxen -phenytoin -procarbazine -propafenone -rasagiline -ritonavir -supplements like St. John's wort, kava kava, valerian -tramadol -tryptophan -vinblastine This list may not describe all possible interactions. Give your health care provider a list of all the medicines, herbs, non-prescription drugs, or dietary supplements you use. Also tell them if you smoke, drink alcohol, or use  illegal drugs. Some items may interact with your medicine. What should I watch for while using this medicine? Tell your doctor if your symptoms do not get better or if they get worse. Visit your doctor or health care professional for regular checks on your progress. Because it may take several weeks to see the full effects of this medicine, it is important to continue your treatment as prescribed by your doctor. Patients and their families should watch out for new or worsening thoughts of suicide or depression. Also watch out for sudden changes in feelings such as feeling anxious, agitated, panicky, irritable, hostile, aggressive, impulsive, severely restless, overly excited and hyperactive, or not being able to sleep. If this happens, especially at the beginning of treatment or after a change in dose, call your health care professional. Michaela Crane may get drowsy or dizzy. Do not drive, use machinery, or do anything that needs mental alertness until you know how this medicine affects you. Do not stand or sit up quickly, especially if you are an older patient. This reduces the risk of dizzy or fainting spells. Alcohol may interfere with the effect of this medicine. Avoid alcoholic drinks. Your mouth may get dry. Chewing sugarless gum or sucking hard candy, and drinking plenty of water may help. Contact your doctor if the problem does not go away or is severe. This medicine may affect blood sugar levels. If you have diabetes, check with your doctor or health care professional before you change your diet or the dose of your diabetic medicine. What side effects may I notice from receiving this medicine? Side effects that you should report to your doctor or health care professional as soon as possible: -allergic reactions like skin rash, itching or hives, swelling of the face, lips, or tongue -breathing problems -confusion -eye pain, changes in vision -fast or irregular heart rate, palpitations -flu-like fever,  chills, cough, muscle or joint  aches and pains -seizures -suicidal thoughts or other mood changes -swelling or redness in or around the eye -tremors -trouble sleeping -unusual bleeding or bruising -unusually tired or weak -vomiting Side effects that usually do not require medical attention (report to your doctor or health care professional if they continue or are bothersome): -change in sex drive or performance -diarrhea -dry mouth -flushing -headache -increased or decreased appetite -nausea -sweating This list may not describe all possible side effects. Call your doctor for medical advice about side effects. You may report side effects to FDA at 1-800-FDA-1088. Where should I keep my medicine? Keep out of the reach of children. Store at room temperature between 15 and 30 degrees C (59 and 86 degrees F). Throw away any unused medicine after the expiration date. NOTE: This sheet is a summary. It may not cover all possible information. If you have questions about this medicine, talk to your doctor, pharmacist, or health care provider.    2016, Elsevier/Gold Standard. (2014-07-16 12:40:07)    Safe Sex Safe sex is about reducing the risk of giving or getting a sexually transmitted disease (STD). STDs are spread through sexual contact involving the genitals, mouth, or rectum. Some STDs can be cured and others cannot. Safe sex can also prevent unintended pregnancies.  WHAT ARE SOME SAFE SEX PRACTICES?  Limit your sexual activity to only one partner who is having sex with only you.  Talk to your partner about his or her past partners, past STDs, and drug use.  Use a condom every time you have sexual intercourse. This includes vaginal, oral, and anal sexual activity. Both females and males should wear condoms during oral sex. Only use latex or polyurethane condoms and water-based lubricants. Using petroleum-based lubricants or oils to lubricate a condom will weaken the condom and  increase the chance that it will break. The condom should be in place from the beginning to the end of sexual activity. Wearing a condom reduces, but does not completely eliminate, your risk of getting or giving an STD. STDs can be spread by contact with infected body fluids and skin.  Get vaccinated for hepatitis B and HPV.  Avoid alcohol and recreational drugs, which can affect your judgment. You may forget to use a condom or participate in high-risk sex.  For females, avoid douching after sexual intercourse. Douching can spread an infection farther into the reproductive tract.  Check your body for signs of sores, blisters, rashes, or unusual discharge. See your health care provider if you notice any of these signs.  Avoid sexual contact if you have symptoms of an infection or are being treated for an STD. If you or your partner has herpes, avoid sexual contact when blisters are present. Use condoms at all other times.  If you are at risk of being infected with HIV, it is recommended that you take a prescription medicine daily to prevent HIV infection. This is called pre-exposure prophylaxis (PrEP). You are considered at risk if:  You are a man who has sex with other men (MSM).  You are a heterosexual man or woman who is sexually active with more than one partner.  You take drugs by injection.  You are sexually active with a partner who has HIV.  Talk with your health care provider about whether you are at high risk of being infected with HIV. If you choose to begin PrEP, you should first be tested for HIV. You should then be tested every 3 months for as long as  you are taking PrEP.  See your health care provider for regular screenings, exams, and tests for other STDs. Before having sex with a new partner, each of you should be screened for STDs and should talk about the results with each other. WHAT ARE THE BENEFITS OF SAFE SEX?   There is less chance of getting or giving an STD.  You  can prevent unwanted or unintended pregnancies.  By discussing safe sex concerns with your partner, you may increase feelings of intimacy, comfort, trust, and honesty between the two of you.   This information is not intended to replace advice given to you by your health care provider. Make sure you discuss any questions you have with your health care provider.   Document Released: 08/30/2004 Document Revised: 08/13/2014 Document Reviewed: 01/14/2012 Elsevier Interactive Patient Education [employer]

## 2015-07-17 NOTE — Progress Notes (Signed)
MRN: [id number] DOB: Feb 07, 1991  Subjective:   Michaela Crane is a 24 y.o. female presenting for chief complaint of Nausea; Depression; and Exposure to STD  Reports that she is here for regular check up for STIs. She has 1 sex partner and does not use protection. Has had chlamydia and received treatment for this. Admits that she has had irregular bleeding, nausea and intermittent vaginal discharge. She denies fever, n/v, abdominal pain, pelvic pain, genital rashes, vaginal irritation. Patient does use an IUD.  Of note, patient was positive on depression screen. She states that she feels overwhelmed with her daily responsibilities. She has a 50 year old daughter, works full time as a Loss adjuster, chartered for a Environmental health practitioner, goes to school to complete her bachelor's degree. She states that at times all the pressure adds up. She tries to take care of herself but feels like there's no time. She denies suicidal ideation or homicidal ideation. Denies history of mental illness but has thought about being on depression medication like Celexa as suggested by a co-worker.  Marlyn has a current medication list which includes the following prescription(s): levonorgestrel. Also has No Known Allergies.  Michaela Crane  has no past medical history on file. Also  has no past surgical history on file.  Objective:   Vitals: BP 116/70 mmHg  Pulse 89  Temp(Src) 98.8 F (37.1 C) (Oral)  Resp 18  Ht  (1.626 m)  Wt 228 lb 9.6 oz (103.692 kg)  BMI 39.22 kg/m2  SpO2 98%  LMP 07/03/2015  Physical Exam  Constitutional: She is oriented to person, place, and time. She appears well-developed and well-nourished.  HENT:  Mouth/Throat: Oropharynx is clear and moist.  Eyes: No scleral icterus.  Cardiovascular: Normal rate, regular rhythm and intact distal pulses.  Exam reveals no gallop and no friction rub.   No murmur heard. Pulmonary/Chest: Effort normal. No respiratory distress. She has no wheezes. She has no  rales.  Abdominal: Soft. Bowel sounds are normal. She exhibits no distension and no mass. There is no tenderness.  Neurological: She is alert and oriented to person, place, and time.  Skin: Skin is warm and dry. No rash noted. No erythema. No pallor.  Psychiatric: She has a normal mood and affect.   Results for orders placed or performed in visit on 07/17/15 (from the past 24 hour(s))  POCT urine pregnancy     Status: None   Collection Time: 07/17/15  3:55 PM  Result Value Ref Range   Preg Test, Ur Negative Negative  POCT Wet + KOH Prep     Status: Abnormal   Collection Time: 07/17/15  3:55 PM  Result Value Ref Range   Yeast by KOH Absent Present, Absent   Yeast by wet prep Absent Present, Absent   WBC by wet prep Few None, Few, Too numerous to count   Clue Cells Wet Prep HPF POC None None, Too numerous to count   Trich by wet prep Absent Present, Absent   Bacteria Wet Prep HPF POC Moderate (A) None, Few, Too numerous to count   Epithelial Cells By [employer] ([facility]) Many (A) None, Few, Too numerous to count   RBC,UR,HPF,POC None None RBC/hpf   Assessment and Plan :   1. Screen for STD (sexually transmitted disease) 2. Unprotected sex - Patient tested negative for her wet prep. Other STI labs pending, her irregular bleeding may be due to Mirena. Will monitor. Recommended safe sex practices.  3. Emotional stress - Counseled  on depression and treatment options. Patient will consider these and follow up with me as needed.  Wallis BambergMario Breon Diss, PA-C [facility] [phone] 07/17/2015 3:26 PM

## 2015-07-18 LAB — RPR

## 2015-07-18 LAB — HIV ANTIBODY (ROUTINE TESTING W REFLEX): HIV: NONREACTIVE

## 2015-07-20 LAB — GC/CHLAMYDIA PROBE AMP
CT Probe RNA: DETECTED — AB
GC PROBE AMP APTIMA: NOT DETECTED

## 2015-07-21 ENCOUNTER — Telehealth: Payer: Self-pay | Admitting: Urgent Care

## 2015-07-21 DIAGNOSIS — A749 Chlamydial infection, unspecified: Secondary | ICD-10-CM

## 2015-07-21 MED ORDER — AZITHROMYCIN 500 MG PO TABS: 500.0000 mg | ORAL_TABLET | Freq: Once | ORAL | Status: DC

## 2015-07-21 NOTE — Telephone Encounter (Signed)
Let patient know about the results. She plans on coming back for recheck in 3-4 weeks.

## 2015-07-21 NOTE — Telephone Encounter (Signed)
LMOM to call back

## 2015-07-21 NOTE — Telephone Encounter (Signed)
Left message for patient to call back. She tested positive for chlamydia. I will send a script for Azithromycin 1g to be taken once. I recommend she return for recheck to make sure she cleared chlamydia in 3-4 weeks. Please notify patient.

## 2015-07-24 NOTE — Telephone Encounter (Signed)
Pt.notified

## 2015-09-10 ENCOUNTER — Emergency Department (HOSPITAL_COMMUNITY)
Admission: EM | Admit: 2015-09-10 | Discharge: 2015-09-10 | Disposition: A | Discharge status: Home / Self Care | Payer: BLUE CROSS/BLUE SHIELD | Attending: Emergency Medicine | Admitting: Emergency Medicine

## 2015-09-10 ENCOUNTER — Encounter (HOSPITAL_COMMUNITY): Payer: Self-pay | Admitting: Emergency Medicine

## 2015-09-10 DIAGNOSIS — Y998 Other external cause status: Secondary | ICD-10-CM | POA: Insufficient documentation

## 2015-09-10 DIAGNOSIS — S0592XA Unspecified injury of left eye and orbit, initial encounter: Secondary | ICD-10-CM | POA: Diagnosis present

## 2015-09-10 DIAGNOSIS — X58XXXA Exposure to other specified factors, initial encounter: Secondary | ICD-10-CM | POA: Insufficient documentation

## 2015-09-10 DIAGNOSIS — S0502XA Injury of conjunctiva and corneal abrasion without foreign body, left eye, initial encounter: Secondary | ICD-10-CM | POA: Diagnosis not present

## 2015-09-10 DIAGNOSIS — Y9389 Activity, other specified: Secondary | ICD-10-CM | POA: Insufficient documentation

## 2015-09-10 DIAGNOSIS — Y9289 Other specified places as the place of occurrence of the external cause: Secondary | ICD-10-CM | POA: Insufficient documentation

## 2015-09-10 MED ORDER — CIPROFLOXACIN HCL 0.3 % OP SOLN: 1.0000 [drp] | OPHTHALMIC | Status: DC

## 2015-09-10 MED ORDER — KETOROLAC TROMETHAMINE 0.5 % OP SOLN: 1.0000 [drp] | Freq: Four times a day (QID) | OPHTHALMIC | Status: DC

## 2015-09-10 MED ORDER — TETRACAINE HCL 0.5 % OP SOLN
1.0000 [drp] | Freq: Once | OPHTHALMIC | Status: AC
  Administered 2015-09-10: 1 [drp] via OPHTHALMIC
  Filled 2015-09-10: qty 4

## 2015-09-10 MED ORDER — FLUORESCEIN SODIUM 1 MG OP STRP
1.0000 | ORAL_STRIP | Freq: Once | OPHTHALMIC | Status: AC
  Administered 2015-09-10: 1 via OPHTHALMIC
  Filled 2015-09-10: qty 1

## 2015-09-10 NOTE — Discharge Instructions (Signed)
Take the prescribed medication as directed.  Recommend to throw out the contact lenses your wearing last night. Wear glasses for the next few days until eye symptoms improve.  Open fresh pair of contacts when ready. Follow-up with your eye doctor if you continue having issues. Return to the ED for new or worsening symptoms.

## 2015-09-10 NOTE — ED Provider Notes (Signed)
CSN: [id number]     Arrival date & time 09/10/15  1910 History   First MD Initiated Contact with Patient 09/10/15 1956     Chief Complaint  Patient presents with  . eye infection      (Consider location/radiation/quality/duration/timing/severity/associated sxs/prior Treatment) The history is provided by the patient and medical records.    25 year old female here for left eye pain. Patient states she went out with friends last night and accidentally slept in her contact lenses. She states these are contacts that should be changed monthly. She states her left eye has felt irritated and has been watering throughout the day which is causing her to have some mildly blurred vision. She states she went to the pharmacy to get some eyedrops but was told to come here for possible eye infection. Patient states normally she wears her glasses, but she does wear contact lenses when going out with friends on occasion.  She denies hx of eye surgeries or prior complications.    History reviewed. No pertinent past medical history. History reviewed. No pertinent past surgical history. History reviewed. No pertinent family history. Social History  Substance Use Topics  . Smoking status: Never Smoker   . Smokeless tobacco: None  . Alcohol Use: Yes   OB History    No data available     Review of Systems  Eyes: Positive for pain and redness.  All other systems reviewed and are negative.     Allergies  Review of patient's allergies indicates no known allergies.  Home Medications   Prior to Admission medications   Medication Sig Start Date End Date Taking? Authorizing Provider  acetaminophen (TYLENOL) 500 MG tablet Take 1,000 mg by mouth every 6 (six) hours as needed for mild pain, moderate pain or headache.   Yes Historical Provider, MD  levonorgestrel (MIRENA) 20 MCG/24HR IUD 1 each by Intrauterine route once.   Yes Historical Provider, MD  azithromycin (ZITHROMAX) 500 MG tablet Take 1 tablet (500  mg total) by mouth once. Patient not taking: Reported on 09/10/2015 07/21/15   Wallis Bamberg, PA-C   BP 132/61 mmHg  Pulse 91  Temp(Src) 98.4 F (36.9 C) (Oral)  Resp 16  SpO2 100%  LMP 08/26/2015   Physical Exam  Constitutional: She is oriented to person, place, and time. She appears well-developed and well-nourished.  HENT:  Head: Normocephalic and atraumatic.  Mouth/Throat: Oropharynx is clear and moist.  No facial swelling or overlying skin changes  Eyes: EOM are normal. Pupils are equal, round, and reactive to light. Left conjunctiva is injected.  Left eye appears irritated; no significant lid edema or erythema conjunctiva injected without hemorrhage; EOMs fully intact and non-painful; eye is watering throughout exam; no FB or signs of trauma noted; small conjunctival abrasion noted just inferior to left iris; no signs of corneal ulcer or uptake  Visual Acuity - Bilateral Near: 20/50 ; R Near: 20/70 ; L Near: 20/70  Neck: Normal range of motion.  Cardiovascular: Normal rate, regular rhythm and normal heart sounds.   Pulmonary/Chest: Effort normal and breath sounds normal. No respiratory distress. She has no wheezes.  Abdominal: Soft. Bowel sounds are normal.  Musculoskeletal: Normal range of motion.  Neurological: She is alert and oriented to person, place, and time.  Skin: Skin is warm and dry.  Psychiatric: She has a normal mood and affect.  Nursing note and vitals reviewed.   ED Course  Procedures (including critical care time) Labs Review Labs Reviewed - No data to display  Imaging Review No results found. I have personally reviewed and evaluated these images and lab results as part of my medical decision-making.   EKG Interpretation None      MDM   Final diagnoses:  Conjunctival abrasion, left, initial encounter   25 year old female here with left eye pain. She actually slept in her contact lenses last night. Left eye appears irritated, conjunctivae is  injected. She has no significant lid edema or erythema to suggest orbital or preseptal cellulitis. Her EOMs are fully intact and nonpainful.  Fluorescein stain with evidence of small conjunctival abrasion just inferior left iris. There is no evidence of corneal ulcer or uptake at this time. After tetracaine application patient had significant improvement of her symptoms. Suspect this abrasion is the source of her pain.  Will start on ciloxin and acular drops.  Patient will follow-up with eye doctor next week.  Discussed plan with patient, he/she acknowledged understanding and agreed with plan of care.  Return precautions given for new or worsening symptoms.  Garlon Hatchet, PA-C 09/10/15 2357  Margarita Grizzle, MD 09/14/15 [phone]

## 2015-09-10 NOTE — ED Notes (Signed)
Pt. Came in with complaint of left eye infection, stated that she noted redness on left eye with pain at9/10, non itchy , with slight blurry vision on said eye.stated that she also experienced pain on the left side of her face. stated that she went pharmacy to get medicine and was told to come here for possible eye infection. Pt. Wears contact for 2 years now.

## 2015-09-29 ENCOUNTER — Ambulatory Visit (INDEPENDENT_AMBULATORY_CARE_PROVIDER_SITE_OTHER): Payer: BLUE CROSS/BLUE SHIELD | Admitting: Physician Assistant

## 2015-09-29 VITALS — BP 110/75 | HR 71 | Temp 99.0°F | Resp 17 | Ht 64.0 in | Wt 229.0 lb

## 2015-09-29 DIAGNOSIS — A5901 Trichomonal vulvovaginitis: Secondary | ICD-10-CM | POA: Diagnosis not present

## 2015-09-29 DIAGNOSIS — N898 Other specified noninflammatory disorders of vagina: Secondary | ICD-10-CM | POA: Diagnosis not present

## 2015-09-29 LAB — POCT WET + KOH PREP
Yeast by KOH: ABSENT
Yeast by wet prep: ABSENT

## 2015-09-29 MED ORDER — ONDANSETRON HCL 4 MG PO TABS: 4.0000 mg | ORAL_TABLET | Freq: Three times a day (TID) | ORAL | Status: DC | PRN

## 2015-09-29 MED ORDER — METRONIDAZOLE 500 MG PO TABS: 2000.0000 mg | ORAL_TABLET | Freq: Once | ORAL | Status: AC

## 2015-09-29 NOTE — Progress Notes (Signed)
   Michaela Crane  MRN: [id number] DOB: [birth year]  Subjective:  Pt presents to clinic with vaginal discharge and she wants to make sure it is ok.  She had chlamydia that was treated in dec and she took the medication and so did her partner.  She wants to make sure it is ok.  She has white discharge and a slight odor but she typically gets some odor right before and right after her menses and she is just finishing up her menses.  There are no active problems to display for this patient.   Current Outpatient Prescriptions on File Prior to Visit  Medication Sig Dispense Refill  . levonorgestrel (MIRENA) 20 MCG/24HR IUD 1 each by Intrauterine route once.     No current facility-administered medications on file prior to visit.    No Known Allergies  Review of Systems  Constitutional: Negative for fever and chills.  Gastrointestinal: Negative for abdominal pain.  Genitourinary: Positive for vaginal discharge. Negative for menstrual problem.   Objective:  BP 110/75 mmHg  Pulse 71  Temp(Src) 99 F (37.2 C) (Oral)  Resp 17  Ht  (1.626 m)  Wt 229 lb (103.874 kg)  BMI 39.29 kg/m2  SpO2 98%  LMP 09/19/2015  Physical Exam  Constitutional: She is oriented to person, place, and time and well-developed, well-nourished, and in no distress.  HENT:  Head: Normocephalic and atraumatic.  Right Ear: Hearing and external ear normal.  Left Ear: Hearing and external ear normal.  Eyes: Conjunctivae are normal.  Neck: Normal range of motion.  Pulmonary/Chest: Effort normal.  Neurological: She is alert and oriented to person, place, and time. Gait normal.  Skin: Skin is warm and dry.  Psychiatric: Mood, memory, affect and judgment normal.  Vitals reviewed.  Results for orders placed or performed in visit on 09/29/15  POCT Wet + KOH Prep  Result Value Ref Range   Yeast by KOH Absent Present, Absent   Yeast by wet prep Absent Present, Absent   WBC by wet prep Many (A) None, Few, Too  numerous to count   Clue Cells Wet Prep HPF POC None None, Too numerous to count   Trich by wet prep Present Present, Absent   Bacteria Wet Prep HPF POC Many (A) None, Few, Too numerous to count   Epithelial Cells By [employer] ([facility]) Many (A) None, Few, Too numerous to count   RBC,UR,HPF,POC Few (A) None RBC/hpf    Assessment and Plan :  Vaginal discharge - Plan: POCT Wet + KOH Prep, GC/Chlamydia Probe Amp  Vaginal trichomoniasis - Plan: metroNIDAZOLE (FLAGYL) 500 MG tablet, ondansetron (ZOFRAN) 4 MG tablet   Partner needs treatment.  Will contact patient with the results.  Benny Lennert PA-C  [facility] 09/29/2015 7:59 PM

## 2015-10-01 LAB — GC/CHLAMYDIA PROBE AMP
CT Probe RNA: NOT DETECTED
GC PROBE AMP APTIMA: NOT DETECTED

## 2015-10-20 ENCOUNTER — Telehealth: Payer: Self-pay

## 2015-10-20 DIAGNOSIS — T3695XA Adverse effect of unspecified systemic antibiotic, initial encounter: Principal | ICD-10-CM

## 2015-10-20 DIAGNOSIS — B379 Candidiasis, unspecified: Secondary | ICD-10-CM

## 2015-10-20 NOTE — Telephone Encounter (Signed)
Pt states she is checking on a request of medication that the pharmacy should have been faxing over  [phone] Best number

## 2015-10-24 MED ORDER — FLUCONAZOLE 150 MG PO TABS: 150.0000 mg | ORAL_TABLET | Freq: Once | ORAL | Status: DC

## 2015-10-24 NOTE — Telephone Encounter (Signed)
Pt.notified

## 2015-10-24 NOTE — Telephone Encounter (Signed)
Sent diflucan to pharmacy

## 2015-10-24 NOTE — Telephone Encounter (Signed)
Pt states she had called for a yeast infection from the medication she was given. Please call [phone]    [facility] [address]

## 2015-10-24 NOTE — Telephone Encounter (Signed)
Flagyl cause yeast? Please advise.

## 2015-10-24 NOTE — Telephone Encounter (Signed)
Called unable to leave message. Can we get the name of the medication please.

## 2015-11-09 IMAGING — CR DG FOOT COMPLETE 3+V*L*
3 series · 3 of 3 positions shown · non-contrast
Comparison: None.

CLINICAL DATA: Left lateral foot pain after fall.

EXAM:
LEFT FOOT - COMPLETE 3+ VIEW

[t foot ap left]
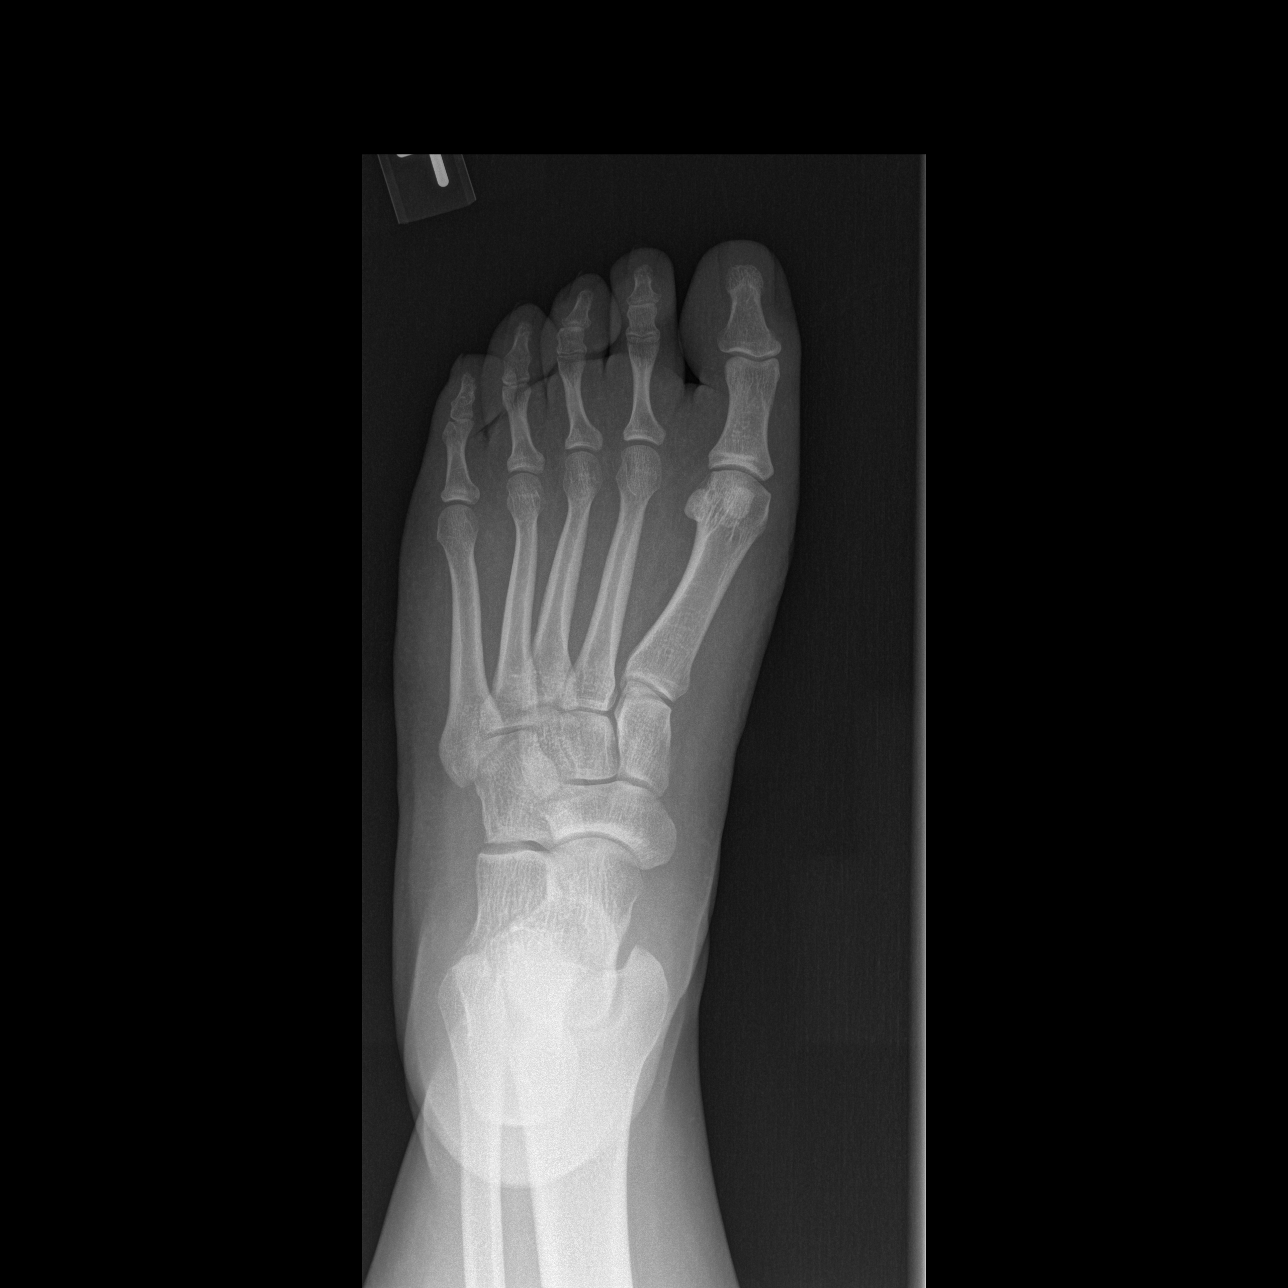

[t foot oblique left]
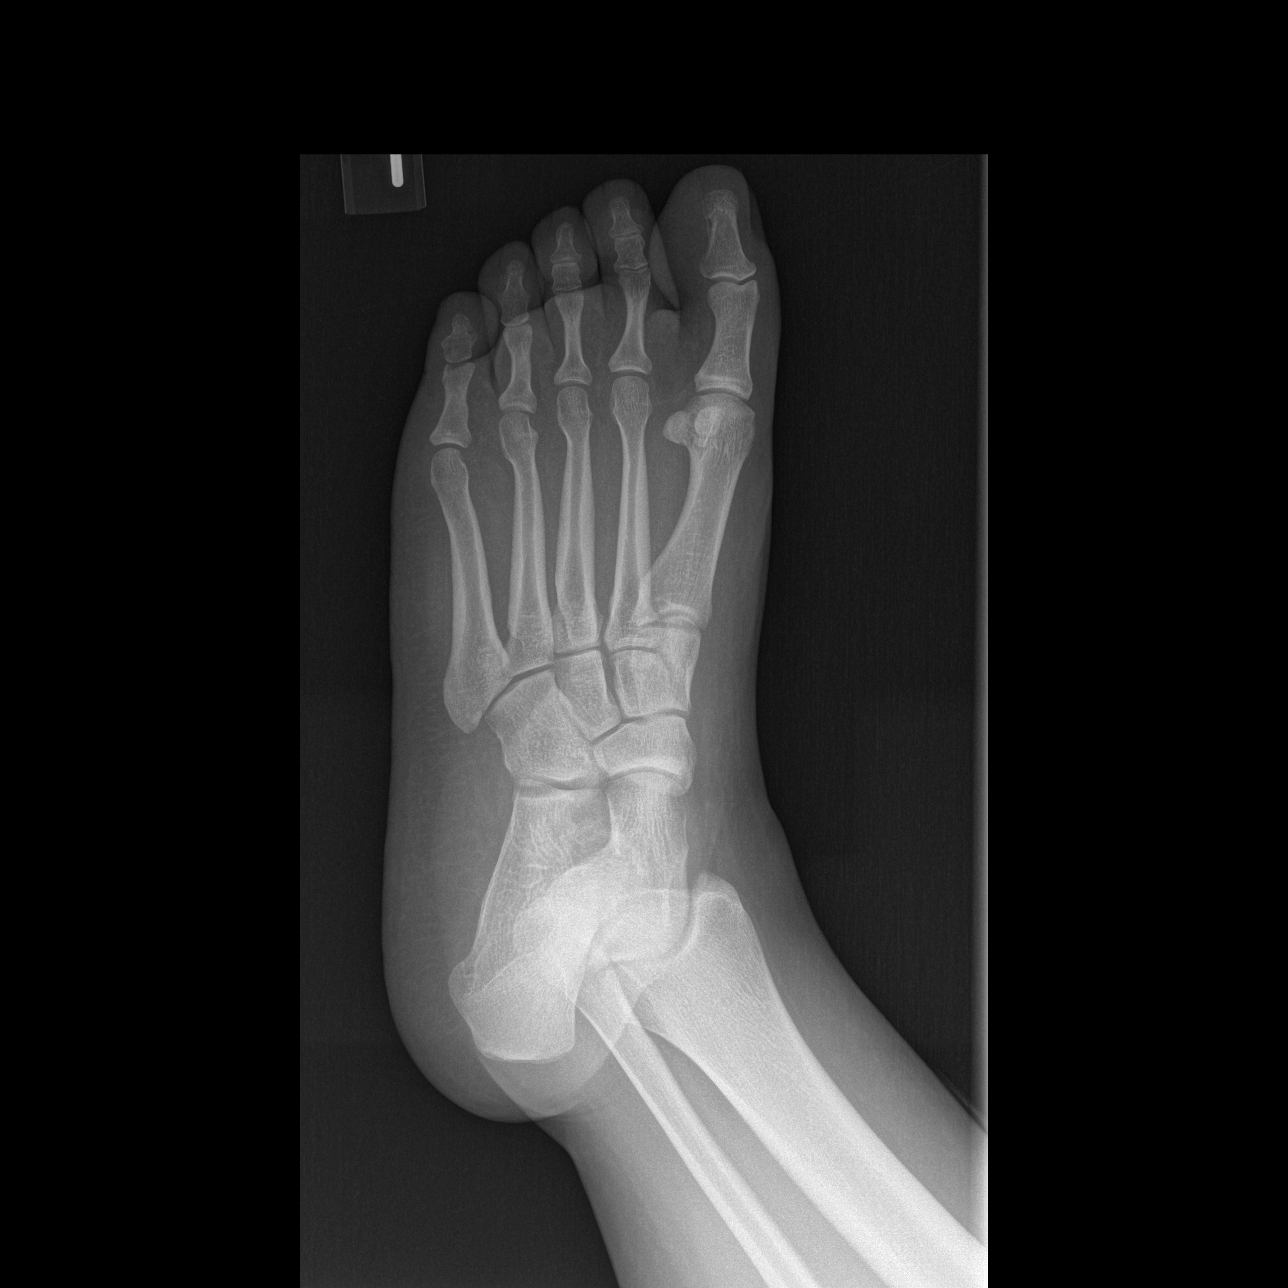

[t foot lat left]
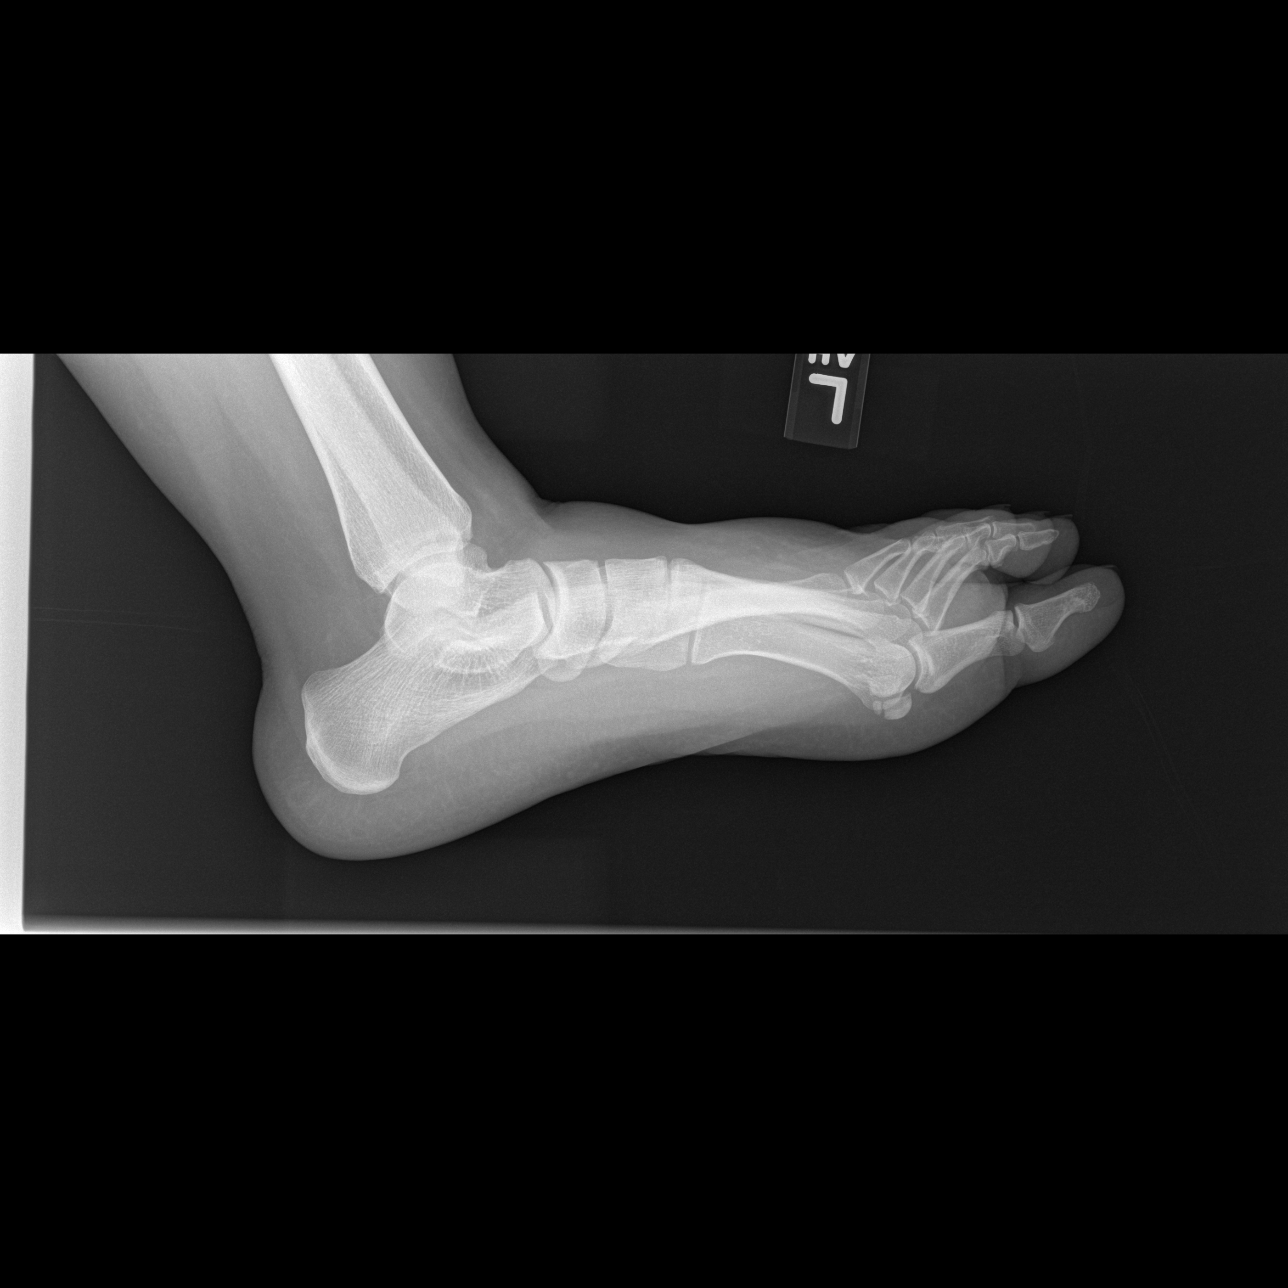

[3 of 3 positions shown; findings below may reference images not displayed]

FINDINGS: There is no evidence of fracture or dislocation. There is no
evidence of arthropathy or other focal bone abnormality. Soft
tissues are unremarkable.
IMPRESSION: Normal left foot.

## 2015-12-06 ENCOUNTER — Ambulatory Visit (INDEPENDENT_AMBULATORY_CARE_PROVIDER_SITE_OTHER): Payer: BLUE CROSS/BLUE SHIELD | Admitting: Physician Assistant

## 2015-12-06 VITALS — BP 118/68 | HR 79 | Temp 98.6°F | Resp 16 | Ht 64.0 in | Wt 232.0 lb

## 2015-12-06 DIAGNOSIS — N898 Other specified noninflammatory disorders of vagina: Secondary | ICD-10-CM

## 2015-12-06 LAB — POCT URINE PREGNANCY: Preg Test, Ur: NEGATIVE

## 2015-12-06 LAB — POCT WET + KOH PREP
TRICH BY WET PREP: ABSENT
YEAST BY WET PREP: ABSENT
Yeast by KOH: ABSENT

## 2015-12-06 NOTE — Patient Instructions (Addendum)
I will call you with your lab results. If negative, we will treat for BV with a vaginal cream Use condoms, every time. All products should be hypoallergenic and fragrance free Dove unscented soap Return if symptoms not improved in 2 weeks.   IF you received an x-ray today, you will receive an invoice from [facility]. Please contact [facility] at [phone] with questions or concerns regarding your invoice.   IF you received labwork today, you will receive an invoice from [facility]. Please contact [facility] at [phone] with questions or concerns regarding your invoice.   Our billing staff will not be able to assist you with questions regarding bills from these companies.  You will be contacted with the lab results as soon as they are available. The fastest way to get your results is to activate your My Chart account. Instructions are located on the last page of this paperwork. If you have not heard from us regarding the results in 2 weeks, please contact this office.

## 2015-12-06 NOTE — Progress Notes (Signed)
[facility] [address], [place] [address] [phone]  Date:  12/06/2015   Name:  Michaela Crane   DOB:  [birth year]   MRN:  [id number]  PCP:  No PCP Per Patient    Chief Complaint: Follow-up and other   History of Present Illness:  This is a 25 y.o. female who is presenting with vaginal discharge. She was diagnosed and treated for chlamydia in December 2016. Partner was treated as well. She was seen here 09/2015 with vaginal discharge and diagnosed with trichomonas and treated with flagyl. Partner was treated for trich as well. She states being treated for trich did not change her symptoms. She continues to have a white vaginal discharge. Odor has changed. She states before being treated for trich it smelled "trashy". Now is smells "fishy". She states her and her partner have been using condoms for every sexual encounter since. She states he recently when to get STD testing and all negative.  Dysuria/Urinary Frequency: no Abdominal pain: no Fever/chills: no Nausea/vominting: nausea, no vomiting Back pain: no LMP: 12/01/15 - She had a new mirena put in almost 1 year ago. Periods have been heavy but are starting to slow down a little. She states when she was on mirena before, she had periods for 3 years and no periods for 2 years Previous BV: yes, a few years ago  Pt prefers to do self swab instead of pelvic exam   Review of Systems:  Review of Systems See HPI  There are no active problems to display for this patient.   Prior to Admission medications   Medication Sig Start Date End Date Taking? Authorizing Provider  levonorgestrel (MIRENA) 20 MCG/24HR IUD 1 each by Intrauterine route once.   Yes Historical Provider, MD    No Known Allergies  History reviewed. No pertinent past surgical history.  Social History  Substance Use Topics  . Smoking status: Never Smoker   . Smokeless tobacco: None  . Alcohol Use: Yes    History reviewed. No pertinent  family history.  Medication list has been reviewed and updated.  Physical Examination:  Physical Exam  Constitutional: She is oriented to person, place, and time. She appears well-developed and well-nourished. No distress.  HENT:  Head: Normocephalic and atraumatic.  Right Ear: Hearing normal.  Left Ear: Hearing normal.  Nose: Nose normal.  Eyes: Conjunctivae and lids are normal. Right eye exhibits no discharge. Left eye exhibits no discharge. No scleral icterus.  Pulmonary/Chest: Effort normal. No respiratory distress.  Musculoskeletal: Normal range of motion.  Neurological: She is alert and oriented to person, place, and time.  Skin: Skin is warm, dry and intact. No lesion and no rash noted.  Psychiatric: She has a normal mood and affect. Her speech is normal and behavior is normal. Thought content normal.   BP 118/68 mmHg  Pulse 79  Temp(Src) 98.6 F (37 C) (Oral)  Resp 16  Ht 5\' 4"  (1.626 m)  Wt 232 lb (105.235 kg)  BMI 39.80 kg/m2  SpO2 98%  LMP 12/01/2015  Results for orders placed or performed in visit on 12/06/15  POCT urine pregnancy  Result Value Ref Range   Preg Test, Ur Negative Negative  POCT Wet + KOH Prep  Result Value Ref Range   Yeast by KOH Absent Present, Absent   Yeast by wet prep Absent Present, Absent   WBC by wet prep Few None, Few, Too numerous to count   Clue Cells Wet Prep HPF  POC Few (A) None, Too numerous to count   Trich by wet prep Absent Present, Absent   Bacteria Wet Prep HPF POC Many (A) None, Few, Too numerous to count   Epithelial Cells By [employer] ([facility]) Many (A) None, Few, Too numerous to count   RBC,UR,HPF,POC None None RBC/hpf    Assessment and Plan:  1. Vaginal discharge Wet prep negative for trich and yeast. Few clues present. G/C pending. If G/C negative, will consider treating for BV with metronidazole gel. We discussed safe sex practices and methods to prevent BV. Greater than 50% of the visit was spent on counseling. -  POCT urine pregnancy - POCT Wet + KOH Prep - GC/Chlamydia Probe Amp   [employer]. Dyke Brackett, MHS [facility]  12/08/2015

## 2015-12-08 LAB — GC/CHLAMYDIA PROBE AMP
CT PROBE, AMP APTIMA: NOT DETECTED
GC Probe RNA: NOT DETECTED

## 2015-12-12 ENCOUNTER — Telehealth: Payer: Self-pay

## 2015-12-12 DIAGNOSIS — N760 Acute vaginitis: Principal | ICD-10-CM

## 2015-12-12 DIAGNOSIS — B9689 Other specified bacterial agents as the cause of diseases classified elsewhere: Secondary | ICD-10-CM

## 2015-12-12 NOTE — Telephone Encounter (Signed)
Pt wants to know if her labs results are in.  Please call [phone]

## 2015-12-12 NOTE — Telephone Encounter (Signed)
Joni Reiningicole please review.

## 2015-12-13 MED ORDER — METRONIDAZOLE 0.75 % VA GEL: 1.0000 | Freq: Every day | VAGINAL | Status: AC

## 2015-12-13 NOTE — Telephone Encounter (Signed)
Labs state pt notified by phone.

## 2015-12-13 NOTE — Telephone Encounter (Signed)
Please let her know STD testing was negative. Will treat for BV with metrogel as discussed. She should insert vaginally before bed for 5 days then stop. No sex for 7-10 days to get vaginal flora stabilized. Return as needed.

## 2015-12-15 ENCOUNTER — Telehealth: Payer: Self-pay

## 2016-01-03 ENCOUNTER — Telehealth: Payer: Self-pay

## 2016-01-03 NOTE — Telephone Encounter (Signed)
Pt was using the gel for Bacteria Vaginosis however she thinks it gave her a yeast infection and would like for something to be called in for this.  Please advise  [phone]

## 2016-01-05 MED ORDER — FLUCONAZOLE 150 MG PO TABS
150.0000 mg | ORAL_TABLET | Freq: Once | ORAL | Status: DC
Start: 2016-01-05 — End: 2019-05-22

## 2016-01-05 NOTE — Telephone Encounter (Signed)
Nicole's pt. Please advise.

## 2016-01-05 NOTE — Telephone Encounter (Signed)
Meds ordered this encounter  Medications  . fluconazole (DIFLUCAN) 150 MG tablet    Sig: Take 1 tablet (150 mg total) by mouth once. Repeat if needed    Dispense:  2 tablet    Refill:  0    Order Specific Question:  Supervising Provider    Answer:  DOOLITTLE, ROBERT P [[id number]]    If symptoms persist, RTC for re-evaluation.

## 2016-01-05 NOTE — Telephone Encounter (Signed)
Pt.notified

## 2016-08-08 ENCOUNTER — Emergency Department (HOSPITAL_BASED_OUTPATIENT_CLINIC_OR_DEPARTMENT_OTHER)
Admission: EM | Admit: 2016-08-08 | Discharge: 2016-08-08 | Disposition: A | Discharge status: Home / Self Care | Payer: BLUE CROSS/BLUE SHIELD | Attending: Emergency Medicine | Admitting: Emergency Medicine

## 2016-08-08 ENCOUNTER — Encounter (HOSPITAL_BASED_OUTPATIENT_CLINIC_OR_DEPARTMENT_OTHER): Payer: Self-pay | Admitting: Emergency Medicine

## 2016-08-08 DIAGNOSIS — M2550 Pain in unspecified joint: Secondary | ICD-10-CM | POA: Diagnosis not present

## 2016-08-08 DIAGNOSIS — J069 Acute upper respiratory infection, unspecified: Secondary | ICD-10-CM | POA: Diagnosis not present

## 2016-08-08 DIAGNOSIS — Z79899 Other long term (current) drug therapy: Secondary | ICD-10-CM | POA: Diagnosis not present

## 2016-08-08 DIAGNOSIS — R05 Cough: Secondary | ICD-10-CM | POA: Diagnosis present

## 2016-08-08 DIAGNOSIS — B9789 Other viral agents as the cause of diseases classified elsewhere: Secondary | ICD-10-CM

## 2016-08-08 MED ORDER — BENZONATATE 100 MG PO CAPS: 100.0000 mg | ORAL_CAPSULE | Freq: Three times a day (TID) | ORAL | 0 refills | Status: DC

## 2016-08-08 MED FILL — BENZONATATE 100 MG CAPSULE: 100 | 7 days supply | Qty: 21 | Fill #0

## 2016-08-08 NOTE — ED Notes (Signed)
Pt made aware to return if symptoms worsen or if any life threatening symptoms occur.   

## 2016-08-08 NOTE — ED Provider Notes (Signed)
[facility] Provider Note   CSN: [id number] Arrival date & time: 08/08/16  1438  By signing my name below, I, Michaela Crane, attest that this documentation has been prepared under the direction and in the presence of [employer], PA-C.  Electronically Signed: Rosario Crane, ED Scribe. 08/08/16. 4:21 PM.  History   Chief Complaint Chief Complaint  Patient presents with  . Cough   The history is provided by the patient. No language interpreter was used.    HPI Comments: Michaela Crane is a 26 y.o. female with no pertinent PMHx, who presents to the Emergency Department complaining of gradually worsening, persistent productive cough beginning two days ago. She reports associated generalized myalgias/arthralgias, congestion, rhinorrhea, headache, chills, and mild sore throat secondary to her cough. She also reports chest pain secondary to cough only. Pt additionally states that she has had mild shortness of breath at night as well. Pt has been taking Sudafed with some relief of her congestion, however, without relief of her cough. No h/o asthma/allergies. Pt did not receive her yearly influenza vaccine. 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 She denies fever, or any other associated symptoms.   History reviewed. No pertinent past medical history.  There are no active problems to display for this patient.  History reviewed. No pertinent surgical history.  OB History    No data available     Home Medications    Prior to Admission medications   Medication Sig Start Date End Date Taking? Authorizing Provider  benzonatate (TESSALON) 100 MG capsule Take 1 capsule (100 mg total) by mouth every 8 (eight) hours. 08/08/16   Eyvonne Mechanic, PA-C  fluconazole (DIFLUCAN) 150 MG tablet Take 1 tablet (150 mg total) by mouth once. Repeat if needed 01/05/16   Porfirio Oar, PA-C  levonorgestrel (MIRENA) 20  MCG/24HR IUD 1 each by Intrauterine route once.    Historical Provider, MD   Family History History reviewed. No pertinent family history.  Social History Social History  Substance Use Topics  . Smoking status: Never Smoker  . Smokeless tobacco: Not on file  . Alcohol use Yes   Allergies   Patient has no known allergies.  Review of Systems Review of Systems  Constitutional: Positive for chills. Negative for fever.  HENT: Positive for congestion, rhinorrhea and sore throat.   Respiratory: Positive for cough.   Cardiovascular: Positive for chest pain (2/2 cough only).  Musculoskeletal: Positive for arthralgias (generalized) and myalgias (generalized).  Neurological: Positive for headaches.  All other systems reviewed and are negative.  Physical Exam Updated Vital Signs BP 128/83 (BP Location: Left Arm)   Pulse 95   Temp 98.6 F (37 C) (Oral)   Resp 18   Ht 5\' 3"  (1.6 m)   Wt 230 lb (104.3 kg)   LMP 07/18/2016 (Approximate)   SpO2 100%   BMI 40.74 kg/m   Physical Exam  Constitutional: She appears well-developed and well-nourished. No distress.  HENT:  Head: Normocephalic and atraumatic.  Right Ear: External ear normal.  Left Ear: Tympanic membrane and external ear normal.  Nose: Rhinorrhea present.  Mouth/Throat: Uvula is midline, oropharynx is clear and moist and mucous membranes are normal. No trismus in the jaw. No uvula swelling. No oropharyngeal exudate, posterior oropharyngeal edema, posterior oropharyngeal erythema or tonsillar abscesses. No tonsillar exudate.  Rhinorrhea and nasal congestion is noted. Left ear cerumen impaction is noted.   Eyes: Conjunctivae  are normal.  Neck: Normal range of motion.  Bilateral anterior cervical adenopathy.   Cardiovascular: Normal rate, regular rhythm and normal heart sounds.   No murmur heard. Pulmonary/Chest: Effort normal and breath sounds normal. No respiratory distress. She has no wheezes. She has no rales.  Abdominal:  She exhibits no distension.  Musculoskeletal: Normal range of motion.  Lymphadenopathy:    She has cervical adenopathy.  Neurological: She is alert.  Skin: No pallor.  Psychiatric: She has a normal mood and affect. Her behavior is normal.  Nursing note and vitals reviewed.  ED Treatments / Results  DIAGNOSTIC STUDIES: Oxygen Saturation is 100% on RA, normal by my interpretation.   COORDINATION OF CARE: 4:20 PM-Discussed next steps with pt. Pt verbalized understanding and is agreeable with the plan.   Labs (all labs ordered are listed, but only abnormal results are displayed) Labs Reviewed - No data to display  EKG  EKG Interpretation None      Radiology No results found.  Procedures Procedures   Medications Ordered in ED Medications - No data to display  Initial Impression / Assessment and Plan / ED Course  I have reviewed the triage vital signs and the nursing notes.  Pertinent labs & imaging results that were available during my care of the patient were reviewed by me and considered in my medical decision making (see chart for details).  Clinical Course    Labs: N/a  Imaging: N/a  Consults: N/a  Therapeutics: N/a  Discharge Meds: 100mg  Tessalon Perles   Assessment/Plan: Patient presentation is most consistent with a viral URI. She is afebrile nontoxic in no acute distress. Patient will be given cough medication, encouraged to use antibiotic, follow-up with primary care if symptoms persist, return if they worsen. She verbalized understanding and agreement to today's plan had no further questions or concerns at time of discharge  Final Clinical Impressions(s) / ED Diagnoses   Final diagnoses:  Viral URI with cough   New Prescriptions New Prescriptions   BENZONATATE (TESSALON) 100 MG CAPSULE    Take 1 capsule (100 mg total) by mouth every 8 (eight) hours.   I personally performed the services described in this documentation, which was scribed in my  presence. The recorded information has been reviewed and is accurate.    Eyvonne MechanicJeffrey Lylianna Fraiser, PA-C 08/08/16 1716    Geoffery Lyonsouglas Delo, MD 08/08/16 2155

## 2016-08-08 NOTE — Discharge Instructions (Signed)
Please read attached information. If you experience any new or worsening signs or symptoms please return to the emergency room for evaluation. Please follow-up with your primary care provider or specialist as discussed. Please use medication prescribed only as directed and discontinue taking if you have any concerning signs or symptoms.   °

## 2016-08-08 NOTE — ED Triage Notes (Signed)
Pt reports productiv cough, bodyaches, bilateral ear pain, chills/fever for 2 days.

## 2017-01-03 ENCOUNTER — Other Ambulatory Visit (HOSPITAL_COMMUNITY)
Admission: RE | Admit: 2017-01-03 | Discharge: 2017-01-03 | Disposition: A | Discharge status: Home / Self Care | Payer: 59 | Source: Ambulatory Visit | Attending: Obstetrics and Gynecology | Admitting: Obstetrics and Gynecology

## 2017-01-03 ENCOUNTER — Other Ambulatory Visit: Payer: Self-pay | Admitting: Obstetrics & Gynecology

## 2017-01-03 DIAGNOSIS — Z01419 Encounter for gynecological examination (general) (routine) without abnormal findings: Secondary | ICD-10-CM | POA: Diagnosis not present

## 2017-01-04 LAB — CYTOLOGY - PAP: Diagnosis: NEGATIVE

## 2018-02-28 ENCOUNTER — Ambulatory Visit (INDEPENDENT_AMBULATORY_CARE_PROVIDER_SITE_OTHER): Payer: 59 | Admitting: Physician Assistant

## 2018-02-28 ENCOUNTER — Encounter: Payer: Self-pay | Admitting: Physician Assistant

## 2018-02-28 VITALS — BP 110/71 | HR 78 | Wt 236.8 lb

## 2018-02-28 DIAGNOSIS — M62838 Other muscle spasm: Secondary | ICD-10-CM

## 2018-02-28 DIAGNOSIS — G43009 Migraine without aura, not intractable, without status migrainosus: Secondary | ICD-10-CM | POA: Diagnosis not present

## 2018-02-28 MED ORDER — CYCLOBENZAPRINE HCL 10 MG PO TABS: 10.0000 mg | ORAL_TABLET | Freq: Three times a day (TID) | ORAL | 1 refills | Status: DC | PRN

## 2018-02-28 MED ORDER — SUMATRIPTAN SUCCINATE 100 MG PO TABS: 100.0000 mg | ORAL_TABLET | Freq: Once | ORAL | 2 refills | Status: DC | PRN

## 2018-02-28 MED ORDER — NAPROXEN 500 MG PO TABS: 500.0000 mg | ORAL_TABLET | Freq: Two times a day (BID) | ORAL | 2 refills | Status: DC

## 2018-02-28 MED ORDER — TOPIRAMATE 25 MG PO TABS: 75.0000 mg | ORAL_TABLET | Freq: Every day | ORAL | 2 refills | Status: DC

## 2018-02-28 NOTE — Progress Notes (Signed)
History:  Michaela Crane is a 27 y.o. No obstetric history on file. who presents to clinic today for headache eval.  She notes a lifetime of headaches starting even before elementary school.  She started wearing glasses and they headaches improved some.  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  The headaches are severe, located over her eyes, sometimes one side and sometimes both.  They can also be in the back.  There is no throbbing, it is worse with movement, sensitivity to light, noise and smells.  There is nausea and some vomiting.  They can last up to 3 days.  She has used hydrocodone and that works along with rest.  No warning.  They sometimes come on when she awakens from sleep and these are the worst.  She feels like she sleeps well but is tired all the time.   She tries to use tylenol or ibuprofen very early before it gets severe.   Other meds tried include excedrin,.  She has not seen a health care provider for this problem.    HIT6:54 Number of days in the last 4 weeks with:  Severe headache: 3 Moderate headache: 7 Mild headache: 8  No headache: 10   No past medical history on file.  Social History   Socioeconomic History  . Marital status: Single    Spouse name: Not on file  . Number of children: Not on file  . Years of education: Not on file  . Highest education level: Not on file  Occupational History  . Not on file  Social Needs  . Financial resource strain: Not on file  . Food insecurity:    Worry: Not on file    Inability: Not on file  . Transportation needs:    Medical: Not on file    Non-medical: Not on file  Tobacco Use  . Smoking status: Never Smoker  Substance and Sexual Activity  . Alcohol use: Yes  . Drug use: No  . Sexual activity: Not on file  Lifestyle  . Physical activity:    Days per week: Not on file    Minutes per session: Not on file  . Stress: Not on file  Relationships  . Social connections:    Talks on phone: Not on file   Gets together: Not on file    Attends religious service: Not on file    Active member of club or organization: Not on file    Attends meetings of clubs or organizations: Not on file    Relationship status: Not on file  . Intimate partner violence:    Fear of current or ex partner: Not on file    Emotionally abused: Not on file    Physically abused: Not on file    Forced sexual activity: Not on file  Other Topics Concern  . Not on file  Social History Narrative  . Not on file    No family history on file.  No Known Allergies  Current Outpatient Medications on File Prior to Visit  Medication Sig Dispense Refill  . levonorgestrel (MIRENA) 20 MCG/24HR IUD 1 each by Intrauterine route once.    . benzonatate (TESSALON) 100 MG capsule Take 1 capsule (100 mg total) by mouth every 8 (eight) hours. (Patient not taking: Reported on 02/28/2018) 21 capsule 0  . fluconazole (DIFLUCAN) 150 MG tablet Take 1 tablet (150 mg total) by mouth once. Repeat if needed (Patient not taking: Reported on 02/28/2018) 2 tablet 0   No  current facility-administered medications on file prior to visit.      Review of Systems:  All pertinent positive/negative included in HPI, all other review of systems are negative   Objective:  Physical Exam BP 110/71   Pulse 78   Wt 236 lb 12.8 oz (107.4 kg)   BMI 41.95 kg/m  CONSTITUTIONAL: Well-developed, well-nourished female in no acute distress.  EYES: EOM intact ENT: Normocephalic CARDIOVASCULAR: Regular rate  RESPIRATORY: Normal rate.  MUSCULOSKELETAL: Normal ROM SKIN: Warm, dry without erythema  NEUROLOGICAL: Alert, oriented, CN II-XII grossly intact, Appropriate balance PSYCH: Normal behavior, mood   Assessment & Plan:  Assessment: 1. Migraine without aura and without status migrainosus, not intractable   2. Muscle spasm    New problem x 2  Plan: Begin Topamax for migraine prevention.  Take 25mg  at night for one week.  If well tolerated, increase to  2 tabs (50mg ) for the next week.  If well tolerated, increase to 3 tabs (75mg ) nightly until seen.   Naprosyn for mild HA. Sumatriptan for acute migraine.   Take at onset.  May repeat in 2 hours.  Limit of 2/day, 2 days per week.  ( Improved efficacy if taken with naprosyn. )  Flexeril for tension or migraine.  Sedation precautions given Regular schedule for sleep, exercise and diet.   Follow-up in 3 months or sooner PRN  Bertram Denver, PA-C 02/28/2018 10:03 AM

## 2018-02-28 NOTE — Patient Instructions (Signed)

## 2018-04-04 ENCOUNTER — Telehealth: Payer: Self-pay | Admitting: Radiology

## 2018-04-04 NOTE — Telephone Encounter (Signed)
Spoke with patient to schedule October Follow up visit with Nada MaclachlanKaren Teague Clark, pt states that she will call back to schedule.

## 2018-09-18 ENCOUNTER — Other Ambulatory Visit: Payer: Self-pay | Admitting: General Surgery

## 2018-09-18 ENCOUNTER — Other Ambulatory Visit (HOSPITAL_COMMUNITY): Payer: Self-pay | Admitting: General Surgery

## 2018-09-26 ENCOUNTER — Ambulatory Visit (HOSPITAL_COMMUNITY)
Admission: RE | Admit: 2018-09-26 | Discharge: 2018-09-26 | Disposition: A | Discharge status: Home / Self Care | Payer: 59 | Source: Ambulatory Visit | Attending: General Surgery | Admitting: General Surgery

## 2018-09-26 ENCOUNTER — Other Ambulatory Visit: Payer: Self-pay

## 2018-10-27 ENCOUNTER — Ambulatory Visit: Payer: Self-pay | Admitting: Skilled Nursing Facility1

## 2019-01-05 DIAGNOSIS — E669 Obesity, unspecified: Secondary | ICD-10-CM

## 2019-01-05 HISTORY — DX: Obesity, unspecified: E66.9

## 2019-01-08 ENCOUNTER — Encounter: Payer: 59 | Attending: General Surgery | Admitting: Dietician

## 2019-01-08 ENCOUNTER — Other Ambulatory Visit: Payer: Self-pay

## 2019-01-08 VITALS — Ht 64.0 in | Wt 241.9 lb

## 2019-01-08 DIAGNOSIS — E669 Obesity, unspecified: Secondary | ICD-10-CM | POA: Diagnosis present

## 2019-01-08 NOTE — Progress Notes (Signed)
Bariatric Pre-Op Nutrition Assessment Medical Nutrition Therapy  Appt Start Time: 9:00am  End time: 10:00am  Patient was seen on 01/08/2019 for Pre-Operative Nutrition Assessment. Assessment and letter of approval faxed to [facility] Bariatric Surgery Program coordinator on 01/08/2019.   Planned surgery: Sleeve Gastrectomy  Pt expectation of surgery: to use as a "reset" and use as motivation to get back to the gym and living a healthier lifestyle  Pt expectation of dietitian: none stated   Anthropometrics  Start weight at [facility]: 241.9 lbs (date: 01/08/2019) Height: 64 in BMI: 45.1 kg/m2    Clinical  Medical Hx: obesity, anxiety, depression, migraine Surgeries: N/A Medications: Imitrex, topiramate  Allergies: NKA  Psychosocial/Lifestyle States she has a busy lifestyle. 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   24-Hr Dietary Recall First Meal: usually skips (or may have bacon, eggs, grits)  Snack: usually skips Second Meal: tacos + avocado  Snack: usually skips  Third Meal: meat + vegetable + starch  Snack: usually skips  Beverages: Sprite, Ginger Ale  Food & Nutrition Related Hx Dietary Hx: Likes [place] food, will eat out a lot. Does not cook at home a lot, but will typically make pasta dish (alfredo) with bread, chili with cornbread, or a meat with vegetables and a starch. Not a picky eater, and does not snack between meals. Will do coffee in the winter months, used to drink energy drinks. Occasionally drinks Vitamin Water.  Estimated Daily Fluid Intake: 2-3 sodas/day  Supplements: MVI, but not consistently GI / Other Notable Symptoms: constipation occasionally, migraines   Physical Activity  Current average weekly physical activity: ADLs, CNA part-time  Estimated Energy Needs Calories: 2000 Carbohydrate: 225g Protein: 125g Fat: 67g  Pre-Op Goals Reviewed with the Patient . Track food and beverage intake (try  MyFitness Pal or the Baritastic app) . Make healthy food choices while monitoring portion sizes . Consume 3 meals per day or try to eat every 3-5 hours . Avoid concentrated sugars and fried foods . Keep sugar & fat in the single digits per serving on food labels . Practice CHEWING your food (aim for applesauce consistency) . Practice not drinking 15 minutes before, during, and 30 minutes after each meal and snack . Avoid all carbonated beverages (ex: soda, sparkling beverages)  . Limit caffeinated beverages (ex: coffee, tea, energy drinks) . Avoid all sugar-sweetened beverages (ex: regular soda, sports drinks)  . Avoid alcohol  . Aim for 64-100 ounces of FLUID daily (with at least half of fluid intake being plain water)  . Aim for at least 60-80 grams of PROTEIN daily . Look for a liquid protein source that contains ?15 g protein and ?5 g carbohydrate (ex: shakes, drinks, shots) . Make a list of non-food related activities . Physical activity is an important part of a healthy lifestyle so keep it moving! The goal is to reach 150 minutes of exercise per week, including cardiovascular and weight baring activity.  Handouts Provided Include  . Bariatric Surgery Nutrition Visits  . Pre-Op Goals  Learning Style & Readiness for Change Teaching method utilized: Visual & Auditory  Demonstrated degree of understanding via: Teach Back  Barriers to learning/adherence to lifestyle change: None Identified  RD's Notes for Next Visit . Finish Pre-Op Goals sheet, starting with protein . Bariatric Protein Shakes and Vitamins and Minerals handouts   Next Steps Supervised Weight Loss (SWL) Visits Needed: 6 *Pt is going to contact [facility]/insurance company to ensure this is the  correct number of SWL  Patient is to return to [facility] in 1 month for 1st SWL Visit.  Patient is to call [facility] to enroll in Pre-Op Class (>2 weeks before surgery) and Post-Op Class (2 weeks after surgery) for further nutrition  education when surgery date is scheduled.

## 2019-01-08 NOTE — Patient Instructions (Signed)
It was great meeting you today! Begin working through the [employer] we talked about today. Pick out 1 or 2 goals to start working on this month. Also, take a look at different protein shakes to start finding some brands/ flavors you like.   See you next month!

## 2019-01-28 ENCOUNTER — Ambulatory Visit: Payer: 59 | Admitting: Skilled Nursing Facility1

## 2019-02-03 ENCOUNTER — Encounter

## 2019-02-10 ENCOUNTER — Other Ambulatory Visit: Payer: Self-pay

## 2019-02-10 ENCOUNTER — Encounter: Payer: 59 | Attending: General Surgery | Admitting: Dietician

## 2019-02-10 ENCOUNTER — Encounter: Payer: Self-pay | Admitting: Dietician

## 2019-02-10 DIAGNOSIS — E669 Obesity, unspecified: Secondary | ICD-10-CM | POA: Diagnosis present

## 2019-02-10 NOTE — Patient Instructions (Addendum)
   Aim to eat at least 3 times a day. Using a protein drink for breakfast or a snack could be a good option, or meal prepping (preparing a lot of food at one time to have ready throughout the week) can save time later on.    See you next month!

## 2019-02-10 NOTE — Progress Notes (Signed)
Bariatric Supervised Weight Loss Visit Appt Start Time: 9:30am  End Time: 9:45am  Planned Surgery: Sleeve Gastrectomy    1st out of 6 SWL Appointments   NUTRITION ASSESSMENT  Anthropometrics  Start weight at [facility]: 241.9 lbs (date: 01/08/2019) Today's weight: 240.7 lbs Weight change: -1.2 lbs (since previous visit on 01/08/2019) BMI: 41.3 kg/m2    Clinical  Medical Hx: obesity, anxiety, depression, migraine Medications:  Imitrex, topiramate  Psychosocial/Lifestyle States she has a busy lifestyle. 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  24-Hr Dietary Recall First Meal: yogurt Snack: none Second Meal: none Snack: [employer]  Third Meal: none Snack: none Beverages: water, vitamin water zero   Food & Nutrition Related Hx Dietary Hx: States lately she has been only eating about 1x/day because she does not have an appetite. Busy lifestyle and lack of consistent schedule makes it difficult to prepare meals at home. Feels hungry, but doesn't eat much d/t lack of appetite. No longer drinks sodas.   Physical Activity  Current average weekly physical activity: ADLs, CNA part-time (walking at work)   Estimated Energy Needs Calories: 2000 Carbohydrate: 225g Protein: 125g Fat: 67g   NUTRITION DIAGNOSIS  Overweight/obesity ([place]-3.3) related to past poor dietary habits and physical inactivity as evidenced by patient w/ planned Sleeve Gastrectomy surgery following dietary guidelines for continued weight loss.   NUTRITION INTERVENTION  Nutrition counseling (C-1) and education (E-2) to facilitate bariatric surgery goals.  Pre-Op Goals Progress & New Goals . No longer drinking carbonated or sugar sweetened beverages! . NEW: Eat at least 3 times per day. May use a protein shake for breakfast as needed. Try meal prepping.   Handouts Provided Include   Bariatric Surgery Protein Shakes   Learning Style & Readiness for Change Teaching method  utilized: Visual & Auditory  Demonstrated degree of understanding via: Teach Back  Barriers to learning/adherence to lifestyle change: None Identified   RD's Notes for next Visit  . Track food and beverage intake (focus on protein goal and fluid goal)  . Avoid drinking fluids with meals/snacks    MONITORING & EVALUATION Dietary intake, weekly physical activity, body weight, and pre-op goals in 1 month.   Next Steps  Patient is to return to [facility] in 1 month for 2nd SWL.

## 2019-03-11 ENCOUNTER — Other Ambulatory Visit: Payer: Self-pay

## 2019-03-11 ENCOUNTER — Encounter: Payer: 59 | Attending: General Surgery | Admitting: Dietician

## 2019-03-11 DIAGNOSIS — E669 Obesity, unspecified: Secondary | ICD-10-CM | POA: Insufficient documentation

## 2019-03-11 NOTE — Progress Notes (Signed)
Bariatric Supervised Weight Loss Visit Appt Start Time: 9:00am  End Time: 9:30am  Planned Surgery: Sleeve Gastrectomy    2nd out of 6 SWL Appointments   NUTRITION ASSESSMENT  Anthropometrics  Start weight at [facility]: 241.9 lbs (date: 01/08/2019) Today's weight: 232.8 lbs   240.7 lbs Weight change: -8 lbs (since previous visit on 02/10/2019) BMI: 39.96 kg/m2    Clinical  Medical Hx: obesity, anxiety, depression, migraine Medications:  Imitrex, topiramate  Psychosocial/Lifestyle States she has a busy lifestyle. 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  24-Hr Dietary Recall First Meal: GF crackers + cream cheese  Snack: none Second Meal: salad Snack: none  Third Meal: none Snack: none Beverages: water, Iaso Tea  Food & Nutrition Related Hx Dietary Hx: States she does not have an appetite, but tries to eat more frequently throughout the day. Busy lifestyle and lack of consistent schedule makes it difficult to prepare meals at home. States she feels really thirsty throughout the day and is often more thirsty than hungry. States she is working on eating until satisfaction rather than fullness.   Physical Activity  Current average weekly physical activity: ADLs, CNA part-time (walking at work)   Estimated Energy Needs Calories: 2000 Carbohydrate: 225g Protein: 125g Fat: 67g   NUTRITION DIAGNOSIS  Overweight/obesity ([place]-3.3) related to past poor dietary habits and physical inactivity as evidenced by patient w/ planned Sleeve Gastrectomy surgery following dietary guidelines for continued weight loss.   NUTRITION INTERVENTION  Nutrition counseling (C-1) and education (E-2) to facilitate bariatric surgery goals.  Pre-Op Goals Progress & New Goals . No longer drinking carbonated or sugar sweetened beverages! . Working on eating at least 3 times per day.  . NEW: Avoid drinking fluids with meals.   Handouts Provided Include    none  Learning Style & Readiness for Change Teaching method utilized: Visual & Auditory  Demonstrated degree of understanding via: Teach Back  Barriers to learning/adherence to lifestyle change: None Identified   RD's Notes for next Visit  . Track food and beverage intake (focus on protein goal and fluid goal)     MONITORING & EVALUATION Dietary intake, weekly physical activity, body weight, and pre-op goals in 1 month.   Next Steps  Patient is to return to [facility] in 1 month for 3rd SWL.

## 2019-03-11 NOTE — Patient Instructions (Signed)
Avoid drinking fluids with meals- stop drinking 15 minutes before eating and wait until 30 minutes after to drink again.

## 2019-04-05 ENCOUNTER — Telehealth: Payer: 59 | Admitting: Nurse Practitioner

## 2019-04-05 DIAGNOSIS — R05 Cough: Secondary | ICD-10-CM

## 2019-04-05 DIAGNOSIS — R059 Cough, unspecified: Secondary | ICD-10-CM

## 2019-04-05 DIAGNOSIS — Z20822 Contact with and (suspected) exposure to covid-19: Secondary | ICD-10-CM

## 2019-04-05 MED ORDER — BENZONATATE 100 MG PO CAPS: 100.0000 mg | ORAL_CAPSULE | Freq: Three times a day (TID) | ORAL | 0 refills | Status: DC | PRN

## 2019-04-05 NOTE — Progress Notes (Signed)
E-Visit for Corona Virus Screening   Your current symptoms could be consistent with the coronavirus.  Many health care providers can now test patients at their office but not all are.  [place] has multiple testing sites. For information on our COVID testing locations and hours go to [email or web address]  Please quarantine yourself while awaiting your test results.  We are enrolling you in our MyChart Home Montioring for COVID19 . Daily you will receive a questionnaire within the MyChart website. Our COVID 19 response team willl be monitoriing your responses daily.  You can go to one of the  testing sites listed below, while they are opened (see hours). You do not need a doctors order to be tested for covid.You do need to self-isolate until your results return and if positive 14 days from when your symptoms started and until you are 3 days symptom free.   Testing Locations (Monday - Friday, 8 a.m. - 3:30 p.m.) . [place]: [facility], [address], [place], [place]  . [place]: [facility], [address], [place], [place] (entrance off [address])  . [place]: [address], [place], [place] (across from [facility])    COVID-19 is a respiratory illness with symptoms that are similar to the flu. Symptoms are typically mild to moderate, but there have been cases of severe illness and death due to the virus. The following symptoms may appear 2-14 days after exposure: . Fever . Cough . Shortness of breath or difficulty breathing . Chills . Repeated shaking with chills . Muscle pain . Headache . Sore throat . New loss of taste or smell . Fatigue . Congestion or runny nose . Nausea or vomiting . Diarrhea  It is vitally important that if you feel that you have an infection such as this virus or any other virus that you stay home and away from places where you may  spread it to others.  You should self-quarantine for 14 days if you have symptoms that could potentially be coronavirus or have been in close contact a with a person diagnosed with COVID-19 within the last 2 weeks. You should avoid contact with people age 65 and older.   You should wear a mask or cloth face covering over your nose and mouth if you must be around other people or animals, including pets (even at home). Try to stay at least 6 feet away from other people. This will protect the people around you.  You can use medication such as A prescription cough medication called Tessalon Perles 100 mg. You may take 1-2 capsules every 8 hours as needed for cough  You may also take acetaminophen (Tylenol) as needed for fever.   Reduce your risk of any infection by using the same precautions used for avoiding the common cold or flu:  . Wash your hands often with soap and warm water for at least 20 seconds.  If soap and water are not readily available, use an alcohol-based hand sanitizer with at least 60% alcohol.  . If coughing or sneezing, cover your mouth and nose by coughing or sneezing into the elbow areas of your shirt or coat, into a tissue or into your sleeve (not your hands). . Avoid shaking hands with others and consider head nods or verbal greetings only. . Avoid touching your eyes, nose, or mouth with unwashed hands.  . Avoid close contact with people who are sick. . Avoid places or   events with large numbers of people in one location, like concerts or sporting events. . Carefully consider travel plans you have or are making. . If you are planning any travel outside or inside the [place], visit the CDC's Travelers' Health webpage for the latest health notices. . If you have some symptoms but not all symptoms, continue to monitor at home and seek medical attention if your symptoms worsen. . If you are having a medical emergency, call 911.  HOME CARE . Only take medications as instructed by your  medical team. . Drink plenty of fluids and get plenty of rest. . A steam or ultrasonic humidifier can help if you have congestion.   GET HELP RIGHT AWAY IF YOU HAVE EMERGENCY WARNING SIGNS** FOR COVID-19. If you or someone is showing any of these signs seek emergency medical care immediately. Call 911 or proceed to your closest emergency facility if: . You develop worsening high fever. . Trouble breathing . Bluish lips or face . Persistent pain or pressure in the chest . New confusion . Inability to wake or stay awake . You cough up blood. . Your symptoms become more severe  **This list is not all possible symptoms. Contact your medical provider for any symptoms that are sever or concerning to you.   MAKE SURE YOU   Understand these instructions.  Will watch your condition.  Will get help right away if you are not doing well or get worse.  Your e-visit answers were reviewed by a board certified advanced clinical practitioner to complete your personal care plan.  Depending on the condition, your plan could have included both over the counter or prescription medications.  If there is a problem please reply once you have received a response from your provider.  Your safety is important to us.  If you have drug allergies check your prescription carefully.    You can use MyChart to ask questions about today's visit, request a non-urgent call back, or ask for a work or school excuse for 24 hours related to this e-Visit. If it has been greater than 24 hours you will need to follow up with your provider, or enter a new e-Visit to address those concerns. You will get an e-mail in the next two days asking about your experience.  I hope that your e-visit has been valuable and will speed your recovery. Thank you for using e-visits.   5-10 minutes spent reviewing and documenting in chart.  

## 2019-04-10 ENCOUNTER — Ambulatory Visit: Payer: 59 | Admitting: Physician Assistant

## 2019-04-10 ENCOUNTER — Encounter: Payer: Self-pay | Admitting: Physician Assistant

## 2019-04-10 ENCOUNTER — Other Ambulatory Visit: Payer: Self-pay

## 2019-04-10 DIAGNOSIS — G44209 Tension-type headache, unspecified, not intractable: Secondary | ICD-10-CM | POA: Diagnosis not present

## 2019-04-10 DIAGNOSIS — G43009 Migraine without aura, not intractable, without status migrainosus: Secondary | ICD-10-CM

## 2019-04-10 MED ORDER — NAPROXEN 500 MG PO TABS: 500.0000 mg | ORAL_TABLET | Freq: Two times a day (BID) | ORAL | 2 refills | Status: DC

## 2019-04-10 MED ORDER — CYCLOBENZAPRINE HCL 10 MG PO TABS: 10.0000 mg | ORAL_TABLET | Freq: Three times a day (TID) | ORAL | 1 refills | Status: DC | PRN

## 2019-04-10 MED ORDER — TOPIRAMATE 25 MG PO TABS: 75.0000 mg | ORAL_TABLET | Freq: Every day | ORAL | 2 refills | Status: DC

## 2019-04-10 MED ORDER — SUMATRIPTAN SUCCINATE 100 MG PO TABS: 100.0000 mg | ORAL_TABLET | Freq: Once | ORAL | 2 refills | Status: DC | PRN

## 2019-04-10 MED ORDER — PROMETHAZINE HCL 25 MG RE SUPP: 25.0000 mg | Freq: Four times a day (QID) | RECTAL | 0 refills | Status: DC | PRN

## 2019-04-10 NOTE — Patient Instructions (Signed)

## 2019-04-10 NOTE — Progress Notes (Signed)
History:  Michaela Crane is a 28 y.o. No obstetric history on file. who presents to clinic today for headache eval.  She was unable to use the Topamax as she is not good at taking medicines regularly.  She was very sedated on a whole tablet of flexeril but did note efficacy with muscle tension and headache.  She does not remember what sumatriptan's effects were.  She feels like naprosyn is helpful.  She is having a headache almost all the time and the quality has not changed.  She is working to lose weight.   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  HIT6: 54 Number of days in the last 4 weeks with:  Severe headache: 3 Moderate headache: 7 Mild headache: 18 No headache: 0   No past medical history on file.  Social History   Socioeconomic History  . Marital status: Single    Spouse name: Not on file  . Number of children: Not on file  . Years of education: Not on file  . Highest education level: Not on file  Occupational History  . Not on file  Social Needs  . Financial resource strain: Not on file  . Food insecurity    Worry: Not on file    Inability: Not on file  . Transportation needs    Medical: Not on file    Non-medical: Not on file  Tobacco Use  . Smoking status: Never Smoker  Substance and Sexual Activity  . Alcohol use: Yes  . Drug use: No  . Sexual activity: Not on file  Lifestyle  . Physical activity    Days per week: Not on file    Minutes per session: Not on file  . Stress: Not on file  Relationships  . Social Musicianconnections    Talks on phone: Not on file    Gets together: Not on file    Attends religious service: Not on file    Active member of club or organization: Not on file    Attends meetings of clubs or organizations: Not on file    Relationship status: Not on file  . Intimate partner violence    Fear of current or ex partner: Not on file    Emotionally abused: Not on file    Physically abused: Not on file     Forced sexual activity: Not on file  Other Topics Concern  . Not on file  Social History Narrative  . Not on file    No family history on file.  No Known Allergies  Current Outpatient Medications on File Prior to Visit  Medication Sig Dispense Refill  . benzonatate (TESSALON PERLES) 100 MG capsule Take 1 capsule (100 mg total) by mouth 3 (three) times daily as needed. 20 capsule 0  . cyclobenzaprine (FLEXERIL) 10 MG tablet Take 1 tablet (10 mg total) by mouth every 8 (eight) hours as needed for muscle spasms. 30 tablet 1  . levonorgestrel (MIRENA) 20 MCG/24HR IUD 1 each by Intrauterine route once.    . naproxen (NAPROSYN) 500 MG tablet Take 1 tablet (500 mg total) by mouth 2 (two) times daily with a meal. As needed for pain 60 tablet 2  . SUMAtriptan (IMITREX) 100 MG tablet Take 1 tablet (100 mg total) by mouth once as needed for up to 1 dose for migraine. May repeat in 2 hours if headache persists or recurs. 9 tablet 2  . topiramate (TOPAMAX) 25 MG tablet Take 3 tablets (75 mg total) by  mouth daily. 90 tablet 2  . fluconazole (DIFLUCAN) 150 MG tablet Take 1 tablet (150 mg total) by mouth once. Repeat if needed (Patient not taking: Reported on 02/28/2018) 2 tablet 0   No current facility-administered medications on file prior to visit.      Review of Systems:  All pertinent positive/negative included in HPI, all other review of systems are negative   Objective:  Physical Exam There were no vitals taken for this visit. CONSTITUTIONAL: Well-developed, well-nourished female in no acute distress.  EYES: EOM intact ENT: Normocephalic RESPIRATORY: Normal rate.  MUSCULOSKELETAL: Normal ROM SKIN: Warm, dry without erythema  NEUROLOGICAL: Alert, oriented, CN II-XII grossly intact, Appropriate balance   PSYCH: Normal behavior, mood   Assessment & Plan:  Assessment: 1. Migraine without aura and without status migrainosus, not intractable   2. Tension headache    unimproved   Plan: Pt is just settling for having a headache every day which is unacceptable.  She has a hard time imagining life without head pain.  But I have tried to counsel her about using her preventive medication regularly to achieve this worthy goal.  She will again begin topamax at 25mg  daily and titrate to 75mg  daily as tolerated.  Naprosyn for mild HA/migraine Sumatriptan for acute migraine - directions given Flexeril in the evening for tension - sedation precautions Phenergan for rescue Follow-up in 12 months or sooner PRN  Michaela Stack, PA-C 04/10/2019 11:09 AM

## 2019-04-14 ENCOUNTER — Other Ambulatory Visit: Payer: Self-pay

## 2019-04-14 ENCOUNTER — Encounter: Payer: Self-pay | Admitting: Dietician

## 2019-04-14 ENCOUNTER — Encounter: Payer: 59 | Attending: General Surgery | Admitting: Dietician

## 2019-04-14 DIAGNOSIS — E669 Obesity, unspecified: Secondary | ICD-10-CM | POA: Diagnosis present

## 2019-04-14 NOTE — Patient Instructions (Signed)
   For drinks with flavor, try the following options that do not provide added sugars/ artificial sweeteners. Good job on drinking more fluids!   Nuun   Amazing Grass   Use the Balanced Snacks sheet to help guide snack options. Remember to try and eat at least 3 times every day.

## 2019-04-14 NOTE — Progress Notes (Signed)
Bariatric Supervised Weight Loss Visit Appt Start Time: 9:20am  End Time: 9:35am  Planned Surgery: Sleeve Gastrectomy    3rd out of 6 SWL Appointments   NUTRITION ASSESSMENT  Anthropometrics  Start weight at [facility]: 241.9 lbs (date: 01/08/2019) Today's weight: 231.8 lbs    Weight change: -1 lb (since previous visit on 03/11/2019) BMI: 39.8 kg/m2    Clinical  Medical Hx: obesity, anxiety, depression, migraine Medications:  Imitrex, topiramate  Psychosocial/Lifestyle Patient is very Scientist, forensic. States she has a busy lifestyle. 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 Has an 69 year old daughter. 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   24-Hr Dietary Recall First Meal: none  Snack: none Second Meal: Vietnamese meal (noodles, seafood)  Snack: none  Third Meal: none Snack: none Beverages: water, Vitamin Water   Food & Nutrition Related Hx Dietary Hx: Busy lifestyle and lack of consistent schedule makes it difficult to prepare meals at home. States she feels really thirsty throughout the day and is often more thirsty than hungry. Not drinking 64 ounces per day yet, but states she has increased her overall fluid intake. States she is drinking Vitamin Waters for something with flavor, dislikes the Zero versions b/c of the aftertaste. Lunch meal is often light, states she does not want to eat a lot or else she gets sleepy in the afternoon. May have a snack at dinner time, but usually only eats 1x/day. Has a mini fridge and microwave available at work.   Today we discussed incorporating appropriate beverage choices (no sugar added, non-carbonated, non-caffeinated) that provide some flavor but do not use artificial sweeteners (pt states she dislikes the aftertaste.) We discussed why sugar-sweetened beverages are not appropriate choices  post-op. We also talked about ways to incorporate more meals/snacks throughout the day, by providing examples of appropriate snack choices and ways to always have options on hand, such as keeping foods in the fridge at work.   Physical Activity  Current average weekly physical activity: ADLs, CNA part-time (walking at work)   Estimated Energy Needs Calories: 2000 Carbohydrate: 225g Protein: 125g Fat: 67g   NUTRITION DIAGNOSIS  Overweight/obesity ([place]-3.3) related to past poor dietary habits and physical inactivity as evidenced by patient w/ planned Sleeve Gastrectomy surgery following dietary guidelines for continued weight loss.   NUTRITION INTERVENTION  Nutrition counseling (C-1) and education (E-2) to facilitate bariatric surgery goals.  Pre-Op Goals Progress & New Goals . No longer drinking carbonated beverages  . Working on eating at least 3 times per day . Avoids drinking fluids with meals . NEW: incorporate more fluids throughout the day to work towards 64 ounces per day  . NEW: incorporate balanced snacks during the day   Handouts Provided Include   Balanced Snacks  Learning Style & Readiness for Change Teaching method utilized: Visual & Auditory  Demonstrated degree of understanding via: Teach Back  Barriers to learning/adherence to lifestyle change: None Identified   RD's Notes for next Visit  . Track food and beverage intake (focus on protein goal and fluid goal)     MONITORING & EVALUATION Dietary intake, weekly physical activity, body weight, and pre-op goals in 1 month.   Next Steps  Patient is to return to [facility] in 1 month for 4th SWL visit.

## 2019-04-17 ENCOUNTER — Encounter: Payer: Self-pay | Admitting: *Deleted

## 2019-05-14 ENCOUNTER — Encounter: Payer: Self-pay | Admitting: Dietician

## 2019-05-14 ENCOUNTER — Encounter: Payer: 59 | Attending: General Surgery | Admitting: Dietician

## 2019-05-14 ENCOUNTER — Other Ambulatory Visit: Payer: Self-pay

## 2019-05-14 DIAGNOSIS — E669 Obesity, unspecified: Secondary | ICD-10-CM

## 2019-05-14 NOTE — Progress Notes (Signed)
Bariatric Supervised Weight Loss Visit Appt Start Time: 9:00am  End Time: 9:15am  Planned Surgery: Sleeve Gastrectomy    4th out of 6 SWL Appointments   NUTRITION ASSESSMENT  Anthropometrics  Start weight at [facility]: 241.9 lbs (date: 01/08/2019) Today's weight: 226 lbs    Weight change: -6 lb (since previous visit on 04/14/2019) BMI: 38.8 kg/m2    Clinical  Medical Hx: obesity, anxiety, depression, migraine Medications:  Imitrex, topiramate  Psychosocial/Lifestyle Patient is very Scientist, forensic. States she has a busy lifestyle. 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   24-Hr Dietary Recall First Meal: avocado + egg   Snack: none Second Meal: sandwich (or leftovers such as meat + rice)   Snack: none  Third Meal: usually skips (or smoothie, or pork chops + roasted vegetables + rice)  Snack: none Beverages: water, Vitamin Water   Food & Nutrition Related Hx Dietary Hx: Busy lifestyle and lack of consistent schedule makes it difficult to prepare meals at home. States she feels really thirsty throughout the day and is often more thirsty than hungry. Not drinking 64 ounces per day yet, but states she has increased her overall fluid intake. States she is drinking Vitamin Waters for something with flavor, dislikes the Zero versions b/c of the aftertaste. Lunch meal is often light, states she does not want to eat a lot or else she gets sleepy in the afternoon. May have a snack at dinner time, but usually only eats 1x/day. Has a mini fridge and microwave available at work.   Still working on incorporating more meals/snacks throughout the day. States she feels hunger in her stomach, but does not have an appetite or taste for food. But, working on eating something anyway.  Groceries include rice or mashed potatoes for starch, will also have roasted vegetables with meat such as pork chops.   Physical Activity  Current average weekly physical activity: ADLs, CNA part-time (walking at work)   Estimated Energy Needs Calories: 2000 Carbohydrate: 225g Protein: 125g Fat: 67g   NUTRITION DIAGNOSIS  Overweight/obesity ([place]-3.3) related to past poor dietary habits and physical inactivity as evidenced by patient w/ planned Sleeve Gastrectomy surgery following dietary guidelines for continued weight loss.   NUTRITION INTERVENTION  Nutrition counseling (C-1) and education (E-2) to facilitate bariatric surgery goals.  Pre-Op Goals Progress & New Goals . No longer drinking carbonated beverages  . Working on eating at least 3 times per day . Avoids drinking fluids with meals . Working on drinking more fluids throughout the day to work towards 64 ounces per day  . Working on incorporating balanced snacks during the day   Handouts Provided Include   Balanced Snacks  Learning Style & Readiness for Change Teaching method utilized: Visual & Auditory  Demonstrated degree of understanding via: Teach Back  Barriers to learning/adherence to lifestyle change: None Identified   RD's Notes for next Visit  . Track food and beverage intake (focus on protein goal and fluid goal)     MONITORING & EVALUATION Dietary intake, weekly physical activity, body weight, and pre-op goals in 1 month.   Next Steps  Patient is to return to [facility] in 1 month for 5th SWL visit.

## 2019-05-22 ENCOUNTER — Ambulatory Visit (HOSPITAL_COMMUNITY)
Admission: EM | Admit: 2019-05-22 | Discharge: 2019-05-22 | Disposition: A | Discharge status: Home / Self Care | Payer: 59 | Attending: Family Medicine | Admitting: Family Medicine

## 2019-05-22 ENCOUNTER — Other Ambulatory Visit: Payer: Self-pay

## 2019-05-22 ENCOUNTER — Encounter (HOSPITAL_COMMUNITY): Payer: Self-pay

## 2019-05-22 DIAGNOSIS — T7840XA Allergy, unspecified, initial encounter: Secondary | ICD-10-CM | POA: Diagnosis not present

## 2019-05-22 DIAGNOSIS — X58XXXA Exposure to other specified factors, initial encounter: Secondary | ICD-10-CM | POA: Diagnosis not present

## 2019-05-22 DIAGNOSIS — E669 Obesity, unspecified: Secondary | ICD-10-CM | POA: Insufficient documentation

## 2019-05-22 DIAGNOSIS — Z79899 Other long term (current) drug therapy: Secondary | ICD-10-CM | POA: Insufficient documentation

## 2019-05-22 DIAGNOSIS — R05 Cough: Secondary | ICD-10-CM | POA: Insufficient documentation

## 2019-05-22 DIAGNOSIS — Z20828 Contact with and (suspected) exposure to other viral communicable diseases: Secondary | ICD-10-CM | POA: Diagnosis not present

## 2019-05-22 DIAGNOSIS — Z793 Long term (current) use of hormonal contraceptives: Secondary | ICD-10-CM | POA: Insufficient documentation

## 2019-05-22 DIAGNOSIS — R0981 Nasal congestion: Secondary | ICD-10-CM | POA: Diagnosis present

## 2019-05-22 MED ORDER — CETIRIZINE HCL 10 MG PO TABS: 10.0000 mg | ORAL_TABLET | Freq: Every day | ORAL | 0 refills | Status: DC

## 2019-05-22 MED ORDER — FLUTICASONE PROPIONATE 50 MCG/ACT NA SUSP: 1.0000 | Freq: Every day | NASAL | 2 refills | Status: DC

## 2019-05-22 NOTE — ED Triage Notes (Signed)
Patient presents to Urgent Care with complaints of nasal congestion, fatigue, and slight cough since 3 months ago. Patient reports she has had a really bad cough that finally got better the past few weeks. Pt states she feels like she has really bad allergies but said it was finally time to come to the doctor. Pt was COVID tested a month ago and it was negative.

## 2019-05-22 NOTE — Discharge Instructions (Addendum)
We believe this is allergy related You can take the zyrtec and Flonase daily for symptoms.  Follow up as needed for continued or worsening symptoms

## 2019-05-22 NOTE — ED Provider Notes (Signed)
[facility]    CSN: [id number] Arrival date & time: 05/22/19  0907      History   Chief Complaint Chief Complaint  Patient presents with  . Allergies  . Nasal Congestion    HPI Michaela Crane is a 28 y.o. female.   Pt is a 28 year old female that presents with allergy type symptoms. Reporting that she had symptoms off and on for 3 months. She has had nasal congestion, fatigue, cough, rhinorrhea, itchy watery eyes. She took zyrtec and ibuprofen with some relief of her symptoms. No associated fever, chills, body aches, sore throat, ear pain. No recent sick contact or recent travels.  ROS per HPI      History reviewed. No pertinent past medical history.  Patient Active Problem List   Diagnosis Date Noted  . Migraine without aura and without status migrainosus, not intractable 04/10/2019  . Obesity 01/08/2019    History reviewed. No pertinent surgical history.  OB History   No obstetric history on file.      Home Medications    Prior to Admission medications   Medication Sig Start Date End Date Taking? Authorizing Provider  benzonatate (TESSALON PERLES) 100 MG capsule Take 1 capsule (100 mg total) by mouth 3 (three) times daily as needed. 04/05/19   Hassell Done, Mary-Margaret, FNP  cetirizine (ZYRTEC) 10 MG tablet Take 1 tablet (10 mg total) by mouth daily. 05/22/19   Winn Muehl, Tressia Miners A, NP  fluticasone (FLONASE) 50 MCG/ACT nasal spray Place 1 spray into both nostrils daily. 05/22/19   Orvan July, NP  levonorgestrel (MIRENA) 20 MCG/24HR IUD 1 each by Intrauterine route once.    [provider]  naproxen (NAPROSYN) 500 MG tablet Take 1 tablet (500 mg total) by mouth 2 (two) times daily with a meal. As needed for pain 04/10/19   Jaclyn Prime, Collene Leyden, PA-C  promethazine (PHENERGAN) 25 MG suppository Place 1 suppository (25 mg total) rectally every 6 (six) hours as needed for nausea or vomiting. 04/10/19   Jaclyn Prime, Collene Leyden, PA-C  SUMAtriptan (IMITREX) 100  MG tablet Take 1 tablet (100 mg total) by mouth once as needed for up to 1 dose for migraine. May repeat in 2 hours if headache persists or recurs. 04/10/19   Jaclyn Prime, Collene Leyden, PA-C  topiramate (TOPAMAX) 25 MG tablet Take 3 tablets (75 mg total) by mouth daily. 04/10/19   Paticia Stack, PA-C    Family History Family History  Problem Relation Age of Onset  . Healthy Mother   . Healthy Father     Social History Social History   Tobacco Use  . Smoking status: Never Smoker  . Smokeless tobacco: Never Used  Substance Use Topics  . Alcohol use: Yes    Comment: socially  . Drug use: No     Allergies   Patient has no known allergies.   Review of Systems Review of Systems   Physical Exam Triage Vital Signs ED Triage Vitals  Enc Vitals Group     BP 05/22/19 0923 (!) 111/54     Pulse Rate 05/22/19 0923 68     Resp 05/22/19 0923 18     Temp 05/22/19 0923 97.7 F (36.5 C)     Temp Source 05/22/19 0923 Temporal     SpO2 05/22/19 0923 100 %     Weight --      Height --      Head Circumference --      Peak Flow --  Pain Score 05/22/19 0920 0     Pain Loc --      Pain Edu? --      Excl. in GC? --    No data found.  Updated Vital Signs BP (!) 111/54 (BP Location: Left Arm)   Pulse 68   Temp 97.7 F (36.5 C) (Temporal)   Resp 18   SpO2 100%   Visual Acuity Right Eye Distance:   Left Eye Distance:   Bilateral Distance:    Right Eye Near:   Left Eye Near:    Bilateral Near:     Physical Exam Vitals signs and nursing note reviewed.  Constitutional:      General: She is not in acute distress.    Appearance: Normal appearance. She is not ill-appearing, toxic-appearing or diaphoretic.  HENT:     Head: Normocephalic and atraumatic.     Right Ear: Tympanic membrane and ear canal normal.     Left Ear: Tympanic membrane and ear canal normal.     Mouth/Throat:     Pharynx: Oropharynx is clear. No posterior oropharyngeal erythema.  Eyes:      Conjunctiva/sclera: Conjunctivae normal.  Neck:     Musculoskeletal: Normal range of motion.  Cardiovascular:     Rate and Rhythm: Normal rate and regular rhythm.     Pulses: Normal pulses.     Heart sounds: Normal heart sounds.  Pulmonary:     Effort: Pulmonary effort is normal.     Breath sounds: Normal breath sounds.  Musculoskeletal: Normal range of motion.  Skin:    General: Skin is warm and dry.  Neurological:     Mental Status: She is alert.  Psychiatric:        Mood and Affect: Mood normal.      UC Treatments / Results  Labs (all labs ordered are listed, but only abnormal results are displayed) Labs Reviewed  NOVEL CORONAVIRUS, NAA (HOSP ORDER, SEND-OUT TO REF LAB; TAT 18-24 HRS)    EKG   Radiology No results found.  Procedures Procedures (including critical care time)  Medications Ordered in UC Medications - No data to display  Initial Impression / Assessment and Plan / UC Course  I have reviewed the triage vital signs and the nursing notes.  Pertinent labs & imaging results that were available during my care of the patient were reviewed by me and considered in my medical decision making (see chart for details).     Allergies- symptoms consistent with this Relief with zyrtec Will have her continue the zyrtec and add Flonase for nasal congestion and sinus pressure.  Follow up as needed for continued or worsening symptoms  Final Clinical Impressions(s) / UC Diagnoses   Final diagnoses:  Allergy, initial encounter     Discharge Instructions     We believe this is allergy related You can take the zyrtec and Flonase daily for symptoms.  Follow up as needed for continued or worsening symptoms      ED Prescriptions    Medication Sig Dispense Auth. Provider   fluticasone (FLONASE) 50 MCG/ACT nasal spray Place 1 spray into both nostrils daily. 16 g Loralyn Rachel A, NP   cetirizine (ZYRTEC) 10 MG tablet Take 1 tablet (10 mg total) by mouth daily.  30 tablet Dahlia Byes A, NP     PDMP not reviewed this encounter.   Dahlia Byes A, NP 05/22/19 1105

## 2019-05-24 LAB — NOVEL CORONAVIRUS, NAA (HOSP ORDER, SEND-OUT TO REF LAB; TAT 18-24 HRS): SARS-CoV-2, NAA: NOT DETECTED

## 2019-06-11 ENCOUNTER — Other Ambulatory Visit: Payer: Self-pay

## 2019-06-11 ENCOUNTER — Encounter: Payer: 59 | Attending: General Surgery | Admitting: Dietician

## 2019-06-11 ENCOUNTER — Encounter: Payer: Self-pay | Admitting: Dietician

## 2019-06-11 DIAGNOSIS — E669 Obesity, unspecified: Secondary | ICD-10-CM | POA: Insufficient documentation

## 2019-06-11 NOTE — Patient Instructions (Signed)
   Continue working on eating more frequently throughout the day. The goal is to have at least 3 meals (or snacks) daily. Try using a protein drink as one of your meals/snacks as needed, maybe have this in the evening if you miss dinner.   Try tracking your fluid intake to make sure you meet the daily goal of 64 (or more) fluid ounces. This is 4 water bottles. Try room temperature to help encourage more drinking, or have a refillable water bottle with you at all times. There are also apps that make noises as a reminder to drink!   Great job on your progress so far! Keep up the good work :)

## 2019-06-11 NOTE — Progress Notes (Signed)
Bariatric Supervised Weight Loss Visit Appt Start Time: 8:55am   End Time: 9:15am  Planned Surgery: Sleeve Gastrectomy    5th out of 6 SWL Appointments   NUTRITION ASSESSMENT  Anthropometrics  Start weight at [facility]: 241.9 lbs (date: 01/08/2019) Today's weight: 224 lbs    Weight change: - 2 lb (since previous visit on 05/14/2019) BMI: 38.5 kg/m2    Clinical  Medical Hx: obesity, anxiety, depression, migraine Medications: see list  Psychosocial/Lifestyle Patient is very personable. States she has a busy lifestyle. 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   24-Hr Dietary Recall First Meal: avocado + egg   Snack: none Second Meal: sandwich (or leftovers such as meat + rice)   Snack: none  Third Meal: usually skips  Snack: none Beverages: water, Vitamin Water   Food & Nutrition Related Hx Dietary Hx: Busy lifestyle and lack of consistent schedule makes it difficult to prepare meals at home. Has a mini fridge and microwave available at work. Working on increasing overall fluid intake, but does not feel as thirsty since it has gotten colder outside and she prefers to drink ice cold water. We discussed trying room temperature water or getting a refillable water bottle to carry.    States she is back to eating primarily just 1 or 2 meals per day, usually misses dinner. States she still feels hunger in her stomach, but does not have an appetite or taste for food. We discussed trying protein drinks or powders mixed with fluid to use as a meal/snack to help increase intake throughout the day.   Physical Activity  Current average weekly physical activity: ADLs, CNA part-time (walking at work)   Estimated Energy Needs Calories: 2000 Carbohydrate: 225g Protein:  125g Fat: 67g   NUTRITION DIAGNOSIS  Overweight/obesity ([place]-3.3) related to past poor dietary habits and physical inactivity as evidenced by patient w/ planned Sleeve Gastrectomy surgery following dietary guidelines for continued weight loss.   NUTRITION INTERVENTION  Nutrition counseling (C-1) and education (E-2) to facilitate bariatric surgery goals.  Pre-Op Goals Progress & New Goals . No longer drinking carbonated beverages  . Working on eating at least 3 times per day . Avoids drinking fluids with meals . Working on drinking more fluids throughout the day to work towards 64 ounces per day (NEW: Try tracking intake and having room temp)   . NEW: Try protein drinks/powders and use as meal/snack   Learning Style & Readiness for Change Teaching method utilized: Visual & Auditory  Demonstrated degree of understanding via: Teach Back  Barriers to learning/adherence to lifestyle change: None Identified   RD's Notes for next Visit  . Physical activity  . Chewing food thoroughly    MONITORING & EVALUATION Dietary intake, weekly physical activity, body weight, and pre-op goals in 1 month.   Next Steps  Patient is to return to [facility] in 1 month for 6th SWL visit.

## 2019-07-06 ENCOUNTER — Other Ambulatory Visit: Payer: Self-pay

## 2019-07-07 ENCOUNTER — Encounter: Payer: Self-pay | Admitting: Family Medicine

## 2019-07-07 ENCOUNTER — Ambulatory Visit (INDEPENDENT_AMBULATORY_CARE_PROVIDER_SITE_OTHER): Payer: 59 | Admitting: Family Medicine

## 2019-07-07 ENCOUNTER — Encounter: Payer: Self-pay | Admitting: Dietician

## 2019-07-07 ENCOUNTER — Encounter: Payer: 59 | Attending: General Surgery | Admitting: Dietician

## 2019-07-07 VITALS — BP 110/64 | HR 83 | Ht 64.0 in | Wt 228.8 lb

## 2019-07-07 DIAGNOSIS — M25561 Pain in right knee: Secondary | ICD-10-CM

## 2019-07-07 DIAGNOSIS — E669 Obesity, unspecified: Secondary | ICD-10-CM | POA: Diagnosis present

## 2019-07-07 DIAGNOSIS — M25562 Pain in left knee: Secondary | ICD-10-CM

## 2019-07-07 NOTE — Progress Notes (Signed)
    Subjective:    CC: B knee pain   Wendy Poet, LAT, ATC, am serving as scribe for Dr. Lynne Leader.  HPI: Pt is a 28 y/o female presenting w/ B knee pain x one week w/ no known MOI.  Her pain is located along the ant-medial knee slightly superior to the patella.  Her pain is intermittent in nature but increases w/ a squatting position or if she's kneeling on her knees.  She reports popping in her B knees.  Pt rates her B knee pain as a 6/10 aching pain at it's worst.  She does note some radiating pain into her B thighs.  She has not tried anything to treat her symptoms.  She has not changed her activity.  She denies any new exercise program.  She denies any injury that may explain her pain.  She denies locking or catching or grinding.  Past medical history, Surgical history, Family history not pertinant except as noted below, Social history, Allergies, and medications have been entered into the medical record, reviewed, and no changes needed.   Review of Systems: No headache, visual changes, nausea, vomiting, diarrhea, constipation, dizziness, abdominal pain, skin rash, fevers, chills, night sweats, weight loss, swollen lymph nodes, body aches, joint swelling, muscle aches, chest pain, shortness of breath, mood changes, visual or auditory hallucinations.   Objective:    Vitals:   07/07/19 1039  BP: 110/64  Pulse: 83  SpO2: 97%   General: Well Developed, well nourished, and in no acute distress.  Neuro/Psych: Alert and oriented x3, extra-ocular muscles intact, able to move all 4 extremities, sensation grossly intact. Skin: Warm and dry, no rashes noted.  Respiratory: Not using accessory muscles, speaking in full sentences, trachea midline.  Cardiovascular: Pulses palpable, no extremity edema. Abdomen: Does not appear distended. MSK:  Left knee: Normal-appearing no effusion. Normal motion without crepitation. Not particularly tender to palpation. Stable ligamentous exam.  Intact  flexion extension strength.  Right knee: Normal-appearing no effusion Normal motion without crepitation. Nontender to palpation. Stable ligamentous exam.  Intact flexion and extension strength.    Impression and Recommendations:    Assessment and Plan: 28 y.o. female with bilateral anterior medial knee pain ongoing for about 1 week with no injury or effusion.  Thought to be patellofemoral pain syndrome.  Discussed options.  Plan for conservative management with focus on VMO strength and trial of Voltaren gel.  If not improving will check back in about a month.  Will obtain x-rays and further work-up and treatment at that time.  Precautions reviewed.  Home exercise program taught by MD..   Discussed warning signs or symptoms. Please see discharge instructions. Patient expresses understanding.   The above documentation has been reviewed and is accurate and complete Lynne Leader

## 2019-07-07 NOTE — Patient Instructions (Signed)
Thank you for coming in today. I think the pain is due to patellofemoral pain syndrome.  Apply over the counter voltaren gel for pain up to 4x daily.  Do the straight leg raises, toe out straight leg raises and knee squeese knee extension.  30 reps 2-3x daily.   Recheck in about 1 month if not improving.     Patellofemoral Pain Syndrome  Patellofemoral pain syndrome is a condition in which the tissue (cartilage) on the underside of the kneecap (patella) softens or breaks down. This causes pain in the front of the knee. The condition is also called runner's knee or chondromalacia patella. Patellofemoral pain syndrome is most common in young adults who are active in sports. The knee is the largest joint in the body. The patella covers the front of the knee and is attached to muscles above and below the knee. The underside of the patella is covered with a smooth type of cartilage (synovium). The smooth surface helps the patella to glide easily when you move your knee. Patellofemoral pain syndrome causes swelling in the joint linings and bone surfaces in the knee. What are the causes? This condition may be caused by:  Overuse of the knee.  Poor alignment of your knee joints.  Weak leg muscles.  A direct blow to your kneecap. What increases the risk? You are more likely to develop this condition if:  You do a lot of activities that can wear down your kneecap. These include: ? Running. ? Squatting. ? Climbing stairs.  You start a new physical activity or exercise program.  You wear shoes that do not fit well.  You do not have good leg strength.  You are overweight. What are the signs or symptoms? The main symptom of this condition is knee pain. This may feel like a dull, aching pain underneath your patella, in the front of your knee. There may be a popping or cracking sound when you move your knee. Pain may get worse with:  Exercise.  Climbing stairs.  Running.  Jumping.   Squatting.  Kneeling.  Sitting for a long time.  Moving or pushing on your patella. How is this diagnosed? This condition may be diagnosed based on:  Your symptoms and medical history. You may be asked about your recent physical activities and which ones cause knee pain.  A physical exam. This may include: ? Moving your patella back and forth. ? Checking your range of knee motion. ? Having you squat or jump to see if you have pain. ? Checking the strength of your leg muscles.  Imaging tests to confirm the diagnosis. These may include an MRI of your knee. How is this treated? This condition may be treated at home with rest, ice, compression, and elevation (RICE).  Other treatments may include:  Nonsteroidal anti-inflammatory drugs (NSAIDs).  Physical therapy to stretch and strengthen your leg muscles.  Shoe inserts (orthotics) to take stress off your knee.  A knee brace or knee support.  Adhesive tapes to the skin.  Surgery to remove damaged cartilage or move the patella to a better position. This is rare. Follow these instructions at home: If you have a shoe or brace:  Wear the shoe or brace as told by your health care provider. Remove it only as told by your health care provider.  Loosen the shoe or brace if your toes tingle, become numb, or turn cold and blue.  Keep the shoe or brace clean.  If the shoe or brace  is not waterproof: ? Do not let it get wet. ? Cover it with a watertight covering when you take a bath or a shower. Managing pain, stiffness, and swelling  If directed, put ice on the painful area. ? If you have a removable shoe or brace, remove it as told by your health care provider. ? Put ice in a plastic bag. ? Place a towel between your skin and the bag. ? Leave the ice on for 20 minutes, 2-3 times a day.  Move your toes often to avoid stiffness and to lessen swelling.  Rest your knee: ? Avoid activities that cause knee pain. ? When sitting or  lying down, raise (elevate) the injured area above the level of your heart, whenever possible. General instructions  Take over-the-counter and prescription medicines only as told by your health care provider.  Use splints, braces, knee supports, or walking aids as directed by your health care provider.  Perform stretching and strengthening exercises as told by your health care provider or physical therapist.  Do not use any products that contain nicotine or tobacco, such as cigarettes and e-cigarettes. These can delay healing. If you need help quitting, ask your health care provider.  Return to your normal activities as told by your health care provider. Ask your health care provider what activities are safe for you.  Keep all follow-up visits as told by your health care provider. This is important. Contact a health care provider if:  Your symptoms get worse.  You are not improving with home care. Summary  Patellofemoral pain syndrome is a condition in which the tissue (cartilage) on the underside of the kneecap (patella) softens or breaks down.  This condition causes swelling in the joint linings and bone surfaces in the knee. This leads to pain in the front of the knee.  This condition may be treated at home with rest, ice, compression, and elevation (RICE).  Use splints, braces, knee supports, or walking aids as directed by your health care provider. This information is not intended to replace advice given to you by your health care provider. Make sure you discuss any questions you have with your health care provider. Document Released: 07/11/2009 Document Revised: 09/02/2017 Document Reviewed: 09/02/2017 Elsevier Patient Education  2020 [employer].

## 2019-07-07 NOTE — Progress Notes (Signed)
Bariatric Supervised Weight Loss Visit Appt Start Time: 8:55am   End Time: 9:10am  Planned Surgery: Sleeve Gastrectomy    6th out of 6 [facility] Appointments    NUTRITION ASSESSMENT  Anthropometrics  Start weight at [facility]: 241.9 lbs (date: 01/08/2019) Today's weight: 227 lbs    Weight change: +3 lb (since previous visit on 06/11/2019) BMI: 39 kg/m2    Clinical  Medical Hx: obesity, anxiety, depression, migraine Medications: see list  Psychosocial/Lifestyle Patient is very personable. States she has a busy lifestyle. 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 Has an 5 year old daughter. 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   24-Hr Dietary Recall First Meal: avocado + egg   Snack: none Second Meal: sandwich (or leftovers such as meat + rice)   Snack: none  Third Meal: usually skips  Snack: none Beverages: water, Vitamin Water   Food & Nutrition Related Hx Dietary Hx: Busy lifestyle and lack of consistent schedule makes it difficult to prepare meals at home. Has a mini fridge and microwave available at work. Working on increasing overall fluid intake. States she still only eats 1 or 2 meals per day, usually misses dinner. States she still feels hunger in her stomach, but does not have an appetite or taste for food.   Physical Activity  Current average weekly physical activity: ADLs, CNA part-time (walking at work)   Estimated Energy Needs Calories: 2000 Carbohydrate: 225g Protein: 125g Fat: 67g   NUTRITION DIAGNOSIS  Overweight/obesity ([place]-3.3) related to past poor dietary habits and physical inactivity as evidenced by patient w/ planned Sleeve Gastrectomy surgery following dietary guidelines for continued weight loss.   NUTRITION INTERVENTION  Nutrition counseling (C-1) and education (E-2) to facilitate bariatric  surgery goals.  Pre-Op Goals Progress & New Goals . No longer drinking carbonated beverages  . Working on eating at least 3 times per day . Avoids drinking fluids with meals . Working on drinking more fluids throughout the day to work towards 64 ounces per day   . Tried protein drinks/powders and use as meal/snack  . Chewing food thoroughly  Learning Style & Readiness for Change Teaching method utilized: Visual & Auditory  Demonstrated degree of understanding via: Teach Back  Barriers to learning/adherence to lifestyle change: None Identified    MONITORING & EVALUATION Dietary intake, weekly physical activity, body weight, and pre-op goals at next nutrition visit.   Next Steps   Patient is to return to [facility] for Pre-Op Class >2 weeks prior to surgery.

## 2019-08-04 ENCOUNTER — Other Ambulatory Visit: Payer: Self-pay | Admitting: Physician Assistant

## 2019-08-10 ENCOUNTER — Other Ambulatory Visit: Payer: Self-pay

## 2019-08-10 NOTE — Telephone Encounter (Signed)
Refill on topiramate 25mg 

## 2019-09-09 ENCOUNTER — Ambulatory Visit: Payer: Self-pay | Admitting: General Surgery

## 2019-09-21 ENCOUNTER — Other Ambulatory Visit: Payer: Self-pay

## 2019-09-21 ENCOUNTER — Encounter: Payer: 59 | Attending: General Surgery | Admitting: Skilled Nursing Facility1

## 2019-09-21 DIAGNOSIS — E669 Obesity, unspecified: Secondary | ICD-10-CM | POA: Diagnosis not present

## 2019-09-21 NOTE — Progress Notes (Signed)
Pre-Operative Nutrition Class:  Appt start time: 0881   End time:  1830.  Patient was seen on 09/21/2019 for Pre-Operative Bariatric Surgery Education at [facility].   Surgery date:  Surgery type: sleeve Start weight at [facility]: 241.9 Weight today: pt arrived too late   The following the learning objectives were met by the patient during this course:  Identify Pre-Op Dietary Goals and will begin 2 weeks pre-operatively  Identify appropriate sources of fluids and proteins   State protein recommendations and appropriate sources pre and post-operatively  Identify Post-Operative Dietary Goals and will follow for 2 weeks post-operatively  Identify appropriate multivitamin and calcium sources  Describe the need for physical activity post-operatively and will follow MD recommendations  State when to call healthcare provider regarding medication questions or post-operative complications  Handouts given during class include:  Pre-Op Bariatric Surgery Diet Handout  Protein Shake Handout  Post-Op Bariatric Surgery Nutrition Handout  BELT Program Information Flyer  Support Group Information Flyer  [facility] Bariatric Supplements Price List  Follow-Up Plan: Patient will follow-up at [facility] 2 weeks post operatively for diet advancement per MD.

## 2019-11-10 ENCOUNTER — Ambulatory Visit: Payer: 59

## 2019-11-16 ENCOUNTER — Inpatient Hospital Stay: Admit: 2019-11-16 | Payer: 59 | Admitting: General Surgery

## 2019-11-16 SURGERY — GASTRECTOMY, SLEEVE, LAPAROSCOPIC
Anesthesia: General

## 2020-01-26 LAB — CYTOLOGY - PAP
HPV Aptima: NEGATIVE
Pap: NEGATIVE

## 2020-02-11 ENCOUNTER — Other Ambulatory Visit: Payer: Self-pay

## 2020-02-11 ENCOUNTER — Ambulatory Visit (HOSPITAL_COMMUNITY)
Admission: EM | Admit: 2020-02-11 | Discharge: 2020-02-11 | Disposition: A | Discharge status: Home / Self Care | Payer: 59 | Attending: Physician Assistant | Admitting: Physician Assistant

## 2020-02-11 ENCOUNTER — Encounter (HOSPITAL_COMMUNITY): Payer: Self-pay

## 2020-02-11 DIAGNOSIS — J069 Acute upper respiratory infection, unspecified: Secondary | ICD-10-CM | POA: Insufficient documentation

## 2020-02-11 DIAGNOSIS — Z20822 Contact with and (suspected) exposure to covid-19: Secondary | ICD-10-CM | POA: Diagnosis not present

## 2020-02-11 LAB — SARS CORONAVIRUS 2 (TAT 6-24 HRS): SARS Coronavirus 2: NEGATIVE

## 2020-02-11 MED ORDER — CEPACOL SORE THROAT 5.4 MG MT LOZG: 1.0000 | LOZENGE | OROMUCOSAL | 0 refills | Status: DC | PRN

## 2020-02-11 MED ORDER — FLUTICASONE PROPIONATE 50 MCG/ACT NA SUSP: 1.0000 | Freq: Every day | NASAL | 0 refills | Status: DC

## 2020-02-11 MED ORDER — ACETAMINOPHEN 325 MG PO TABS: 650.0000 mg | ORAL_TABLET | Freq: Four times a day (QID) | ORAL | 0 refills | Status: DC | PRN

## 2020-02-11 NOTE — ED Triage Notes (Signed)
PT c/o sore throat, dysphagia, body aches and nasal congestion started yesterday.

## 2020-02-11 NOTE — Discharge Instructions (Signed)
Use the cepacol for sore throat Take tylenol for sore throat and body ache Use flonase daily  If not improving over 1 week, return or follow up with your PCP  If your Covid-19 test is positive, you will receive a phone call from [facility] regarding your results. Negative test results are not called. Both positive and negative results area always visible on MyChart. If you do not have a MyChart account, sign up instructions are in your discharge papers.   Persons who are directed to care for themselves at home may discontinue isolation under the following conditions:   At least 10 days have passed since symptom onset and  At least 24 hours have passed without running a fever (this means without the use of fever-reducing medications) and  Other symptoms have improved.  Persons infected with COVID-19 who never develop symptoms may discontinue isolation and other precautions 10 days after the date of their first positive COVID-19 test.

## 2020-02-11 NOTE — ED Provider Notes (Signed)
[facility]    CSN: [id number] Arrival date & time: 02/11/20  1040      History   Chief Complaint Chief Complaint  Patient presents with  . multiple complaints    HPI Michaela Crane is a 29 y.o. female.   Patient presents for congestion, sore throat and runny nose.  She also reports body aches and chills.  She reports these all started yesterday.  She reports a slight cough occasionally more so to clear throat.  Denies shortness of breath.  Denies measured fever but does endorse body ache and chills at times.  Denies headache, nausea or vomiting.  Denies diarrhea.  Denies abdominal pain.  Denies sick contacts.  She received both Covid vaccines in February.     Past Medical History:  Diagnosis Date  . Anxiety   . Depression   . Obesity 01/2019    Patient Active Problem List   Diagnosis Date Noted  . Migraine without aura and without status migrainosus, not intractable 04/10/2019  . Obesity 01/08/2019    History reviewed. No pertinent surgical history.  OB History   No obstetric history on file.      Home Medications    Prior to Admission medications   Medication Sig Start Date End Date Taking? Authorizing Provider  acetaminophen (TYLENOL) 325 MG tablet Take 2 tablets (650 mg total) by mouth every 6 (six) hours as needed. 02/11/20   Shaiann Mcmanamon, Veryl Speak, PA-C  benzonatate (TESSALON PERLES) 100 MG capsule Take 1 capsule (100 mg total) by mouth 3 (three) times daily as needed. 04/05/19   Daphine Deutscher, Mary-Margaret, FNP  cetirizine (ZYRTEC) 10 MG tablet Take 1 tablet (10 mg total) by mouth daily. Patient not taking: Reported on 07/07/2019 05/22/19   Janace Aris, NP  ESTARYLLA 0.25-35 MG-MCG tablet Take 1 tablet by mouth daily. 01/26/20   [provider]  fluticasone (FLONASE) 50 MCG/ACT nasal spray Place 1 spray into both nostrils daily. 02/11/20   Corliss Coggeshall, Veryl Speak, PA-C  levonorgestrel (MIRENA) 20 MCG/24HR IUD 1 each by Intrauterine route once.    [provider]  Menthol (CEPACOL SORE THROAT) 5.4 MG LOZG Use as directed 1 lozenge (5.4 mg total) in the mouth or throat every 2 (two) hours as needed. 02/11/20   Tahj Njoku, Veryl Speak, PA-C  naproxen (NAPROSYN) 500 MG tablet Take 1 tablet (500 mg total) by mouth 2 (two) times daily with a meal. As needed for pain Patient not taking: Reported on 07/07/2019 04/10/19   Glyn Ade, Scot Jun, PA-C  promethazine (PHENERGAN) 25 MG suppository Place 1 suppository (25 mg total) rectally every 6 (six) hours as needed for nausea or vomiting. Patient not taking: Reported on 07/07/2019 04/10/19   Glyn Ade, Scot Jun, PA-C  SUMAtriptan (IMITREX) 100 MG tablet Take 1 tablet (100 mg total) by mouth once as needed for up to 1 dose for migraine. May repeat in 2 hours if headache persists or recurs. 04/10/19   Glyn Ade, Scot Jun, PA-C  topiramate (TOPAMAX) 25 MG tablet TAKE 3 TABLETS(75 MG) BY MOUTH DAILY 08/10/19   Glyn Ade, Scot Jun, PA-C    Family History Family History  Problem Relation Age of Onset  . Healthy Mother   . Healthy Father     Social History Social History   Tobacco Use  . Smoking status: Never Smoker  . Smokeless tobacco: Never Used  Vaping Use  . Vaping Use: Some days  . Substances: Nicotine, Flavoring  Substance Use Topics  . Alcohol  use: Yes    Comment: socially  . Drug use: No     Allergies   Patient has no known allergies.   Review of Systems Review of Systems   Physical Exam Triage Vital Signs ED Triage Vitals  Enc Vitals Group     BP 02/11/20 1210 117/82     Pulse Rate 02/11/20 1210 85     Resp 02/11/20 1210 16     Temp 02/11/20 1210 99.5 F (37.5 C)     Temp Source 02/11/20 1210 Oral     SpO2 --      Weight 02/11/20 1211 207 lb (93.9 kg)     Height 02/11/20 1211 5\' 2"  (1.575 m)     Head Circumference --      Peak Flow --      Pain Score 02/11/20 1211 4     Pain Loc --      Pain Edu? --      Excl. in GC? --    No data found.  Updated Vital Signs BP 117/82    Pulse 85   Temp 99.5 F (37.5 C) (Oral)   Resp 16   Ht 5\' 2"  (1.575 m)   Wt 207 lb (93.9 kg)   BMI 37.86 kg/m   Visual Acuity Right Eye Distance:   Left Eye Distance:   Bilateral Distance:    Right Eye Near:   Left Eye Near:    Bilateral Near:     Physical Exam Vitals and nursing note reviewed.  Constitutional:      General: She is not in acute distress.    Appearance: She is well-developed.  HENT:     Head: Normocephalic and atraumatic.     Right Ear: Tympanic membrane normal.     Left Ear: Tympanic membrane normal.     Nose: Congestion and rhinorrhea present.     Mouth/Throat:     Mouth: Mucous membranes are moist.     Comments: Postnasal drip Eyes:     Extraocular Movements: Extraocular movements intact.     Conjunctiva/sclera: Conjunctivae normal.     Pupils: Pupils are equal, round, and reactive to light.  Cardiovascular:     Rate and Rhythm: Normal rate and regular rhythm.     Heart sounds: No murmur heard.   Pulmonary:     Effort: Pulmonary effort is normal. No respiratory distress.     Breath sounds: Normal breath sounds.  Abdominal:     Palpations: Abdomen is soft.     Tenderness: There is no abdominal tenderness.  Musculoskeletal:     Cervical back: Neck supple.  Lymphadenopathy:     Cervical: No cervical adenopathy.  Skin:    General: Skin is warm and dry.  Neurological:     Mental Status: She is alert.      UC Treatments / Results  Labs (all labs ordered are listed, but only abnormal results are displayed) Labs Reviewed  SARS CORONAVIRUS 2 (TAT 6-24 HRS)    EKG   Radiology No results found.  Procedures Procedures (including critical care time)  Medications Ordered in UC Medications - No data to display  Initial Impression / Assessment and Plan / UC Course  I have reviewed the triage vital signs and the nursing notes.  Pertinent labs & imaging results that were available during my care of the patient were reviewed by me and  considered in my medical decision making (see chart for details).     #Viral URI  Patient is a  29 year old otherwise healthy female with a viral URI syndrome.  Appears well in clinic today.  We will treat symptomatically.  She is requesting Covid testing for her child.  Cepacol and Tylenol for sore throat.  Flonase for nasal symptoms.  Return follow-up precautions were discussed.  Patient verbalized understanding plan of care. Final Clinical Impressions(s) / UC Diagnoses   Final diagnoses:  Viral URI  Encounter for laboratory testing for COVID-19 virus     Discharge Instructions     Use the cepacol for sore throat Take tylenol for sore throat and body ache Use flonase daily  If not improving over 1 week, return or follow up with your PCP  If your Covid-19 test is positive, you will receive a phone call from [facility] regarding your results. Negative test results are not called. Both positive and negative results area always visible on MyChart. If you do not have a MyChart account, sign up instructions are in your discharge papers.   Persons who are directed to care for themselves at home may discontinue isolation under the following conditions:  . At least 10 days have passed since symptom onset and . At least 24 hours have passed without running a fever (this means without the use of fever-reducing medications) and . Other symptoms have improved.  Persons infected with COVID-19 who never develop symptoms may discontinue isolation and other precautions 10 days after the date of their first positive COVID-19 test.      ED Prescriptions    Medication Sig Dispense Auth. Provider   acetaminophen (TYLENOL) 325 MG tablet Take 2 tablets (650 mg total) by mouth every 6 (six) hours as needed. 30 tablet Chonda Baney, Veryl Speak, PA-C   Menthol (CEPACOL SORE THROAT) 5.4 MG LOZG Use as directed 1 lozenge (5.4 mg total) in the mouth or throat every 2 (two) hours as needed. 30 lozenge Tasheema Perrone, Veryl Speak, PA-C   fluticasone (FLONASE) 50 MCG/ACT nasal spray Place 1 spray into both nostrils daily. 11.1 mL Danira Nylander, Veryl Speak, PA-C     PDMP not reviewed this encounter.   Hermelinda Medicus, PA-C 02/11/20 1259

## 2020-02-22 ENCOUNTER — Telehealth: Payer: Self-pay | Admitting: Radiology

## 2020-02-22 NOTE — Telephone Encounter (Signed)
Left message for patient to call [id number] to schedule headache f/u for September.

## 2020-04-01 ENCOUNTER — Other Ambulatory Visit: Payer: Self-pay

## 2020-04-01 ENCOUNTER — Encounter: Payer: Self-pay | Admitting: *Deleted

## 2020-04-01 ENCOUNTER — Encounter: Payer: Self-pay | Admitting: Physician Assistant

## 2020-04-01 ENCOUNTER — Ambulatory Visit: Payer: 59 | Admitting: Physician Assistant

## 2020-04-01 VITALS — BP 101/62 | HR 74 | Wt 191.0 lb

## 2020-04-01 DIAGNOSIS — G43009 Migraine without aura, not intractable, without status migrainosus: Secondary | ICD-10-CM

## 2020-04-01 MED ORDER — TOPIRAMATE 100 MG PO TABS: 100.0000 mg | ORAL_TABLET | Freq: Every day | ORAL | 11 refills | Status: DC

## 2020-04-01 MED ORDER — NAPROXEN 500 MG PO TABS: 500.0000 mg | ORAL_TABLET | Freq: Two times a day (BID) | ORAL | 2 refills | Status: DC

## 2020-04-01 NOTE — Progress Notes (Signed)
History:  Michaela Crane is a 29 y.o. No obstetric history on file. who presents to clinic today for yearly headache followup.  She is no longer having daily headaches.  She is using 75mg  topamax and has tingling.  When she gets a headache she uses one or two naprosyn and goes to bed.  This helps.  She is afraid to try sumatriptan.  She has been losing weight with diet restriction.  Is not exercising.  HIT6:54 Number of days in the last 4 weeks with:  Severe headache: 5 Moderate headache: 2 Mild headache: 5  No headache: 16   Past Medical History:  Diagnosis Date  . Anxiety   . Depression   . Obesity 01/2019    Social History   Socioeconomic History  . Marital status: Single    Spouse name: Not on file  . Number of children: Not on file  . Years of education: Not on file  . Highest education level: Not on file  Occupational History  . Not on file  Tobacco Use  . Smoking status: Never Smoker  . Smokeless tobacco: Never Used  Vaping Use  . Vaping Use: Some days  . Substances: Nicotine, Flavoring  Substance and Sexual Activity  . Alcohol use: Yes    Comment: socially  . Drug use: No  . Sexual activity: Not on file  Other Topics Concern  . Not on file  Social History Narrative  . Not on file   Social Determinants of Health   Financial Resource Strain:   . Difficulty of Paying Living Expenses: Not on file  Food Insecurity:   . Worried About 02/2019 in the Last Year: Not on file  . Ran Out of Food in the Last Year: Not on file  Transportation Needs:   . Lack of Transportation (Medical): Not on file  . Lack of Transportation (Non-Medical): Not on file  Physical Activity:   . Days of Exercise per Week: Not on file  . Minutes of Exercise per Session: Not on file  Stress:   . Feeling of Stress : Not on file  Social Connections:   . Frequency of Communication with Friends and Family: Not on file  . Frequency of Social Gatherings with Friends and Family: Not  on file  . Attends Religious Services: Not on file  . Active Member of Clubs or Organizations: Not on file  . Attends Programme researcher, broadcasting/film/video Meetings: Not on file  . Marital Status: Not on file  Intimate Partner Violence:   . Fear of Current or Ex-Partner: Not on file  . Emotionally Abused: Not on file  . Physically Abused: Not on file  . Sexually Abused: Not on file    Family History  Problem Relation Age of Onset  . Healthy Mother   . Healthy Father     No Known Allergies  Current Outpatient Medications on File Prior to Visit  Medication Sig Dispense Refill  . acetaminophen (TYLENOL) 325 MG tablet Take 2 tablets (650 mg total) by mouth every 6 (six) hours as needed. 30 tablet 0  . ESTARYLLA 0.25-35 MG-MCG tablet Take 1 tablet by mouth daily.    04-22-1999 topiramate (TOPAMAX) 25 MG tablet TAKE 3 TABLETS(75 MG) BY MOUTH DAILY 90 tablet 2  . benzonatate (TESSALON PERLES) 100 MG capsule Take 1 capsule (100 mg total) by mouth 3 (three) times daily as needed. (Patient not taking: Reported on 04/01/2020) 20 capsule 0  . cetirizine (ZYRTEC) 10 MG tablet Take  1 tablet (10 mg total) by mouth daily. (Patient not taking: Reported on 07/07/2019) 30 tablet 0  . fluticasone (FLONASE) 50 MCG/ACT nasal spray Place 1 spray into both nostrils daily. 11.1 mL 0  . levonorgestrel (MIRENA) 20 MCG/24HR IUD 1 each by Intrauterine route once. (Patient not taking: Reported on 04/01/2020)    . Menthol (CEPACOL SORE THROAT) 5.4 MG LOZG Use as directed 1 lozenge (5.4 mg total) in the mouth or throat every 2 (two) hours as needed. (Patient not taking: Reported on 04/01/2020) 30 lozenge 0  . naproxen (NAPROSYN) 500 MG tablet Take 1 tablet (500 mg total) by mouth 2 (two) times daily with a meal. As needed for pain (Patient not taking: Reported on 07/07/2019) 60 tablet 2  . promethazine (PHENERGAN) 25 MG suppository Place 1 suppository (25 mg total) rectally every 6 (six) hours as needed for nausea or vomiting. (Patient not taking:  Reported on 07/07/2019) 12 each 0  . SUMAtriptan (IMITREX) 100 MG tablet Take 1 tablet (100 mg total) by mouth once as needed for up to 1 dose for migraine. May repeat in 2 hours if headache persists or recurs. (Patient not taking: Reported on 04/01/2020) 9 tablet 2   No current facility-administered medications on file prior to visit.     Review of Systems:  All pertinent positive/negative included in HPI, all other review of systems are negative   Objective:  Physical Exam BP 101/62   Pulse 74   Wt 191 lb (86.6 kg)   BMI 34.93 kg/m  CONSTITUTIONAL: Well-developed, well-nourished female in no acute distress.  EYES: EOM intact ENT: Normocephalic CARDIOVASCULAR: Regular rate  RESPIRATORY: Normal rate.  MUSCULOSKELETAL: Normal ROM SKIN: Warm, dry without erythema  NEUROLOGICAL: Alert, oriented, CN II-XII grossly intact, Appropriate balance PSYCH: Normal behavior, mood   Assessment & Plan:  Assessment: 1. Migraine without aura and without status migrainosus, not intractable   improving but insufficient improvement   Plan: Vitamin B6 to help with tingling Will try increasing Topamax to 100mg  daily for improved HA prevention.  Call if tingling unbearable Nurtec for acute migraine.  Try this weekend so she does not get scared and forget/be afraid to use it. Naprosyn for headache prn Follow-up in 12 months or sooner PRN  , PA-C 04/01/2020 8:57 AM

## 2020-04-01 NOTE — Patient Instructions (Signed)

## 2020-05-17 ENCOUNTER — Other Ambulatory Visit: Payer: Self-pay | Admitting: Physician Assistant

## 2020-05-17 MED ORDER — FLUCONAZOLE 150 MG PO TABS: 150.0000 mg | ORAL_TABLET | Freq: Every day | ORAL | 3 refills | Status: DC

## 2020-05-17 NOTE — Progress Notes (Signed)
Pt states she tested for yeast at her place of work and was positive for yeast on wet prep.  She has vaginal itching and white clumpy discharge.  She requests diflucan.  This is done for pt.

## 2020-06-15 ENCOUNTER — Encounter (HOSPITAL_BASED_OUTPATIENT_CLINIC_OR_DEPARTMENT_OTHER): Payer: Self-pay | Admitting: *Deleted

## 2020-06-15 ENCOUNTER — Emergency Department (HOSPITAL_BASED_OUTPATIENT_CLINIC_OR_DEPARTMENT_OTHER): Payer: 59

## 2020-06-15 ENCOUNTER — Other Ambulatory Visit: Payer: Self-pay

## 2020-06-15 ENCOUNTER — Emergency Department (HOSPITAL_BASED_OUTPATIENT_CLINIC_OR_DEPARTMENT_OTHER)
Admission: EM | Admit: 2020-06-15 | Discharge: 2020-06-15 | Disposition: A | Discharge status: Home / Self Care | Payer: 59 | Attending: Emergency Medicine | Admitting: Emergency Medicine

## 2020-06-15 DIAGNOSIS — N200 Calculus of kidney: Secondary | ICD-10-CM

## 2020-06-15 DIAGNOSIS — R102 Pelvic and perineal pain: Secondary | ICD-10-CM | POA: Diagnosis present

## 2020-06-15 DIAGNOSIS — N132 Hydronephrosis with renal and ureteral calculous obstruction: Secondary | ICD-10-CM | POA: Insufficient documentation

## 2020-06-15 LAB — CBC WITH DIFFERENTIAL/PLATELET
Abs Immature Granulocytes: 0.12 10*3/uL — ABNORMAL HIGH (ref 0.00–0.07)
Basophils Absolute: 0.1 10*3/uL (ref 0.0–0.1)
Basophils Relative: 1 %
Eosinophils Absolute: 0 10*3/uL (ref 0.0–0.5)
Eosinophils Relative: 0 %
HCT: 43 % (ref 36.0–46.0)
Hemoglobin: 13.3 g/dL (ref 12.0–15.0)
Immature Granulocytes: 1 %
Lymphocytes Relative: 6 %
Lymphs Abs: 1.4 10*3/uL (ref 0.7–4.0)
MCH: 28.5 pg (ref 26.0–34.0)
MCHC: 30.9 g/dL (ref 30.0–36.0)
MCV: 92.3 fL (ref 80.0–100.0)
Monocytes Absolute: 1.2 10*3/uL — ABNORMAL HIGH (ref 0.1–1.0)
Monocytes Relative: 5 %
Neutro Abs: 19.3 10*3/uL — ABNORMAL HIGH (ref 1.7–7.7)
Neutrophils Relative %: 87 %
Platelets: 376 10*3/uL (ref 150–400)
RBC: 4.66 MIL/uL (ref 3.87–5.11)
RDW: 13 % (ref 11.5–15.5)
WBC: 22.1 10*3/uL — ABNORMAL HIGH (ref 4.0–10.5)
nRBC: 0 % (ref 0.0–0.2)

## 2020-06-15 LAB — URINALYSIS, MICROSCOPIC (REFLEX)

## 2020-06-15 LAB — URINALYSIS, ROUTINE W REFLEX MICROSCOPIC
Bilirubin Urine: NEGATIVE
Glucose, UA: NEGATIVE mg/dL
Ketones, ur: NEGATIVE mg/dL
Nitrite: NEGATIVE
Protein, ur: NEGATIVE mg/dL
Specific Gravity, Urine: 1.015 (ref 1.005–1.030)
pH: 6.5 (ref 5.0–8.0)

## 2020-06-15 LAB — COMPREHENSIVE METABOLIC PANEL
ALT: 30 U/L (ref 0–44)
AST: 30 U/L (ref 15–41)
Albumin: 4.2 g/dL (ref 3.5–5.0)
Alkaline Phosphatase: 52 U/L (ref 38–126)
Anion gap: 13 (ref 5–15)
BUN: 14 mg/dL (ref 6–20)
CO2: 23 mmol/L (ref 22–32)
Calcium: 9 mg/dL (ref 8.9–10.3)
Chloride: 105 mmol/L (ref 98–111)
Creatinine, Ser: 0.99 mg/dL (ref 0.44–1.00)
GFR, Estimated: >60 mL/min (ref >60)
Glucose, Bld: 121 mg/dL — ABNORMAL HIGH (ref 70–99)
Potassium: 3.5 mmol/L (ref 3.5–5.1)
Sodium: 141 mmol/L (ref 135–145)
Total Bilirubin: 0.5 mg/dL (ref 0.3–1.2)
Total Protein: 7.5 g/dL (ref 6.5–8.1)

## 2020-06-15 LAB — PREGNANCY, URINE: Preg Test, Ur: NEGATIVE

## 2020-06-15 MED ORDER — ONDANSETRON HCL 4 MG/2ML IJ SOLN
4.0000 mg | Freq: Once | INTRAMUSCULAR | Status: AC
  Administered 2020-06-15: 4 mg via INTRAVENOUS
  Filled 2020-06-15: qty 2

## 2020-06-15 MED ORDER — MORPHINE SULFATE (PF) 4 MG/ML IV SOLN
4.0000 mg | Freq: Once | INTRAVENOUS | Status: DC
  Filled 2020-06-15: qty 1

## 2020-06-15 MED ORDER — KETOROLAC TROMETHAMINE 15 MG/ML IJ SOLN
15.0000 mg | Freq: Once | INTRAMUSCULAR | Status: AC
  Administered 2020-06-15: 15 mg via INTRAVENOUS
  Filled 2020-06-15: qty 1

## 2020-06-15 MED ORDER — HYDROCODONE-ACETAMINOPHEN 5-325 MG PO TABS
1.0000 | ORAL_TABLET | Freq: Four times a day (QID) | ORAL | 0 refills | Status: DC | PRN
Start: 2020-06-15 — End: 2020-06-25

## 2020-06-15 MED ORDER — LACTATED RINGERS IV BOLUS
1000.0000 mL | Freq: Once | INTRAVENOUS | Status: AC
  Administered 2020-06-15: 1000 mL via INTRAVENOUS

## 2020-06-15 MED ORDER — MORPHINE SULFATE (PF) 4 MG/ML IV SOLN
4.0000 mg | Freq: Once | INTRAVENOUS | Status: AC
  Administered 2020-06-15: 4 mg via INTRAVENOUS
  Filled 2020-06-15: qty 1

## 2020-06-15 NOTE — ED Provider Notes (Signed)
[facility] EMERGENCY DEPARTMENT Provider Note   CSN: [id number] Arrival date & time: 06/15/20  7846     History Chief Complaint  Patient presents with  . Abdominal Pain    Michaela Crane is a 29 y.o. female.  The history is provided by the patient.  Abdominal Pain Pain location: right pelvic. Pain quality: sharp and shooting   Pain radiation: right flank. Pain severity:  Severe Onset quality:  Sudden Duration:  2 hours Timing:  Constant Progression:  Waxing and waning Chronicity:  New Context comment:  Was driving when pain started.  felt fine all day until about 6pm Relieved by:  Nothing Worsened by:  Nothing Ineffective treatments:  None tried Associated symptoms: anorexia, chills, nausea and vomiting   Associated symptoms: no cough, no diarrhea, no dysuria, no fever, no hematuria and no shortness of breath   Associated symptoms comment:  Never had symptoms like this before.  Currently on menses but does not use any birth control.  No vaginal discharge other than blood. Risk factors: no alcohol abuse, has not had multiple surgeries and not pregnant        Past Medical History:  Diagnosis Date  . Anxiety   . Depression   . Obesity 01/2019    Patient Active Problem List   Diagnosis Date Noted  . Migraine without aura and without status migrainosus, not intractable 04/10/2019  . Obesity 01/08/2019    History reviewed. No pertinent surgical history.   OB History   No obstetric history on file.     Family History  Problem Relation Age of Onset  . Healthy Mother   . Healthy Father     Social History   Tobacco Use  . Smoking status: Never Smoker  . Smokeless tobacco: Never Used  Vaping Use  . Vaping Use: Some days  . Substances: Nicotine, Flavoring  Substance Use Topics  . Alcohol use: Yes    Comment: socially  . Drug use: No    Home Medications Prior to Admission medications   Medication Sig Start Date End Date Taking?  Authorizing Provider  acetaminophen (TYLENOL) 325 MG tablet Take 2 tablets (650 mg total) by mouth every 6 (six) hours as needed. 02/11/20   Darr, Gerilyn Pilgrim, PA-C  benzonatate (TESSALON PERLES) 100 MG capsule Take 1 capsule (100 mg total) by mouth 3 (three) times daily as needed. Patient not taking: Reported on 04/01/2020 04/05/19   Bennie Pierini, FNP  cetirizine (ZYRTEC) 10 MG tablet Take 1 tablet (10 mg total) by mouth daily. Patient not taking: Reported on 07/07/2019 05/22/19   Janace Aris, NP  ESTARYLLA 0.25-35 MG-MCG tablet Take 1 tablet by mouth daily. 01/26/20   [provider]  fluconazole (DIFLUCAN) 150 MG tablet Take 1 tablet (150 mg total) by mouth daily. 05/17/20   Teague Chestine Spore, Scot Jun, PA-C  fluticasone (FLONASE) 50 MCG/ACT nasal spray Place 1 spray into both nostrils daily. 02/11/20   Darr, Gerilyn Pilgrim, PA-C  levonorgestrel (MIRENA) 20 MCG/24HR IUD 1 each by Intrauterine route once. Patient not taking: Reported on 04/01/2020    [provider]  Menthol (CEPACOL SORE THROAT) 5.4 MG LOZG Use as directed 1 lozenge (5.4 mg total) in the mouth or throat every 2 (two) hours as needed. Patient not taking: Reported on 04/01/2020 02/11/20   Darr, Gerilyn Pilgrim, PA-C  naproxen (NAPROSYN) 500 MG tablet Take 1 tablet (500 mg total) by mouth 2 (two) times daily with a meal. As needed for pain 04/01/20   Glyn Ade,  Scot Jun, PA-C  promethazine (PHENERGAN) 25 MG suppository Place 1 suppository (25 mg total) rectally every 6 (six) hours as needed for nausea or vomiting. Patient not taking: Reported on 07/07/2019 04/10/19   Glyn Ade, Scot Jun, PA-C  SUMAtriptan (IMITREX) 100 MG tablet Take 1 tablet (100 mg total) by mouth once as needed for up to 1 dose for migraine. May repeat in 2 hours if headache persists or recurs. Patient not taking: Reported on 04/01/2020 04/10/19   Glyn Ade, Scot Jun, PA-C  topiramate (TOPAMAX) 100 MG tablet Take 1 tablet (100 mg total) by mouth daily. 04/01/20   Bertram Denver, PA-C    Allergies    Patient has no known allergies.  Review of Systems   Review of Systems  Constitutional: Positive for chills. Negative for fever.  Respiratory: Negative for cough and shortness of breath.   Gastrointestinal: Positive for abdominal pain, anorexia, nausea and vomiting. Negative for diarrhea.  Genitourinary: Negative for dysuria and hematuria.  All other systems reviewed and are negative.   Physical Exam Updated Vital Signs BP 132/80 (BP Location: Left Arm)   Pulse 80   Temp 98.2 F (36.8 C) (Oral)   Resp 16   Ht 5\' 10"  (1.778 m)   Wt 90.7 kg   LMP 06/15/2020   SpO2 100%   BMI 28.70 kg/m   Physical Exam Vitals and nursing note reviewed.  Constitutional:      General: She is not in acute distress.    Appearance: She is well-developed and normal weight.     Comments: Appears uncomfortable  HENT:     Head: Normocephalic and atraumatic.  Eyes:     Pupils: Pupils are equal, round, and reactive to light.  Cardiovascular:     Rate and Rhythm: Normal rate and regular rhythm.     Heart sounds: Normal heart sounds. No murmur heard.  No friction rub.  Pulmonary:     Effort: Pulmonary effort is normal.     Breath sounds: Normal breath sounds. No wheezing or rales.  Abdominal:     General: Bowel sounds are normal. There is no distension.     Palpations: Abdomen is soft.     Tenderness: There is abdominal tenderness in the right lower quadrant. There is no right CVA tenderness, guarding or rebound.     Comments: Minimal right pelvic pain  Musculoskeletal:        General: No tenderness. Normal range of motion.     Comments: No edema  Skin:    General: Skin is warm and dry.     Findings: No rash.  Neurological:     Mental Status: She is alert and oriented to person, place, and time.     Cranial Nerves: No cranial nerve deficit.  Psychiatric:        Behavior: Behavior normal.     ED Results / Procedures / Treatments   Labs (all labs ordered  are listed, but only abnormal results are displayed) Labs Reviewed  CBC WITH DIFFERENTIAL/PLATELET - Abnormal; Notable for the following components:      Result Value   WBC 22.1 (*)    Neutro Abs 19.3 (*)    Monocytes Absolute 1.2 (*)    Abs Immature Granulocytes 0.12 (*)    All other components within normal limits  COMPREHENSIVE METABOLIC PANEL - Abnormal; Notable for the following components:   Glucose, Bld 121 (*)    All other components within normal limits  URINALYSIS, ROUTINE W REFLEX  MICROSCOPIC - Abnormal; Notable for the following components:   Hgb urine dipstick LARGE (*)    Leukocytes,Ua TRACE (*)    All other components within normal limits  URINALYSIS, MICROSCOPIC (REFLEX) - Abnormal; Notable for the following components:   Bacteria, UA RARE (*)    All other components within normal limits  PREGNANCY, URINE    EKG None  Radiology CT Renal Stone Study  Result Date: 06/15/2020 CLINICAL DATA:  30 year old female with flank pain. Concern for kidney stone. EXAM: CT ABDOMEN AND PELVIS WITHOUT CONTRAST TECHNIQUE: Multidetector CT imaging of the abdomen and pelvis was performed following the standard protocol without IV contrast. COMPARISON:  None. FINDINGS: Evaluation of this exam is limited in the absence of intravenous contrast. Lower chest: The visualized lung bases are clear. No intra-abdominal free air or free fluid. Hepatobiliary: There is an ill-defined 3.2 x 2.7 cm hypodense lesion in the left lobe of the liver (segment II B) which is not characterized on this noncontrast CT. This may represent a hemangioma, FNH, or adenoma given the patient's age. Other etiologies are not excluded. Further evaluation with MRI without and with contrast is recommended. No intrahepatic biliary ductal dilatation. The gallbladder is unremarkable. Pancreas: Unremarkable. No pancreatic ductal dilatation or surrounding inflammatory changes. Spleen: Normal in size without focal abnormality.  Adrenals/Urinary Tract: The adrenal glands unremarkable. There is a 4 mm stone in the proximal right ureter with mild right hydronephrosis. Several additional nonobstructing bilateral renal calculi measure up to 4 mm in the inferior pole of the left kidney. A 4 mm stone in the upper pole of the right kidney may be within a collecting system or calyceal diverticulum or in the renal parenchyma. There is no hydronephrosis on the left. The left ureter and urinary bladder appear unremarkable. Stomach/Bowel: There is no bowel obstruction or active inflammation. The appendix is normal. Vascular/Lymphatic: The abdominal aorta and IVC unremarkable. No portal venous gas. There is no adenopathy. Reproductive: The uterus is grossly unremarkable. There is a 3 cm right ovarian dominant cyst or follicle. The left ovary is unremarkable. Other: None Musculoskeletal: No acute or significant osseous findings. IMPRESSION: 1. A 4 mm proximal right ureteral stone with mild right hydronephrosis. Additional nonobstructing bilateral renal calculi noted. No hydronephrosis on the left. 2. A 3 cm right ovarian dominant cyst or follicle. 3. A 3.2 x 2.7 cm hypodense lesion in the left lobe of the liver. Further characterization with MRI without and with contrast, on a nonemergent/outpatient basis, is recommended. Electronically Signed   By: Elgie Collard M.D.   On: 06/15/2020 22:44    Procedures Procedures (including critical care time)  Medications Ordered in ED Medications  lactated ringers bolus 1,000 mL (1,000 mLs Intravenous New Bag/Given 06/15/20 2057)  morphine 4 MG/ML injection 4 mg (4 mg Intravenous Given 06/15/20 2101)  ondansetron (ZOFRAN) injection 4 mg (4 mg Intravenous Given 06/15/20 2058)    ED Course  I have reviewed the triage vital signs and the nursing notes.  Pertinent labs & imaging results that were available during my care of the patient were reviewed by me and considered in my medical decision making  (see chart for details).    MDM Rules/Calculators/A&P                          Patient with a sudden onset of pain approximately 2 to 3 hours ago while she was driving in her car. Patient has been on her menses but  denies any other symptoms. She felt completely fine prior to the pain starting. She reports the pain initially was a 10 out of 10 and it has eased up a little bit to where now it is a 7 out of 10. She denies any urinary or vaginal complaints. Based on patient's acute onset of pain concern for possible right ovarian torsion versus renal stone. No prior symptoms of similar pain. Patient CBC with a leukocytosis of 22,000 today of unknown significance. CMP and UA are pending. Renal CT pending. Low suspicion for appendicitis, diverticulitis, cholecystitis at this time. Patient given pain control.  11:35 PM Labs with large hemoglobin in the urine but no evidence of infection, leukocytosis of 22,000 which is most likely reactive in nature, CMP without acute findings.  Pregnancy test is negative.  CT shows evidence of right 4 mm proximal ureteral stone which is most likely the cause of her symptoms.  On repeat evaluation after 4 morphine patient reports her pain is much better.  She was given Toradol and prescription for pain medication at home.  She was given urology follow-up as needed and return precautions.  MDM Number of Diagnoses or Management Options   Amount and/or Complexity of Data Reviewed Clinical lab tests: ordered and reviewed Tests in the radiology section of CPT: reviewed and ordered Independent visualization of images, tracings, or specimens: yes  Risk of Complications, Morbidity, and/or Mortality Presenting problems: high Diagnostic procedures: low Management options: low  Patient Progress Patient progress: improved     Final Clinical Impression(s) / ED Diagnoses Final diagnoses:  Kidney stone    Rx / DC Orders ED Discharge Orders         Ordered     HYDROcodone-acetaminophen (NORCO/VICODIN) 5-325 MG tablet  Every 6 hours PRN        06/15/20 2334           Gwyneth SproutPlunkett, Josephanthony Tindel, MD 06/15/20 2337

## 2020-06-15 NOTE — Discharge Instructions (Signed)
You have a kidney stone today on the right side.  It you are most likely going to have pain that comes and goes in severity over the next 1 to 2 days.  Make sure you are drinking plenty of fluids.  If you start having severe pain, vomiting or fever please return to the emergency room.

## 2020-06-15 NOTE — ED Notes (Signed)
Patient transported to Ultrasound 

## 2020-06-15 NOTE — ED Triage Notes (Addendum)
Pt in triage in w/c. Friend reports C/o sudden onset of right lower abd pain x 1 hr , with n/v , pt refusing to answer  Some questions in triage , demanding to be taken straight back to a room to lay down, instructed pt that VSS and we have no room sat this time

## 2020-06-24 ENCOUNTER — Encounter (HOSPITAL_COMMUNITY): Payer: Self-pay | Admitting: Urology

## 2020-06-24 ENCOUNTER — Other Ambulatory Visit: Payer: Self-pay

## 2020-06-24 ENCOUNTER — Other Ambulatory Visit: Payer: Self-pay | Admitting: Urology

## 2020-06-24 NOTE — Progress Notes (Signed)
Spoke with Pt and she confirms understanding of preop instructions and 0630 arrival, NPO status.

## 2020-06-24 NOTE — Anesthesia Preprocedure Evaluation (Addendum)
Anesthesia Evaluation  Patient identified by MRN, date of birth, ID band Patient awake    Reviewed: Allergy & Precautions, NPO status , Patient's Chart, lab work & pertinent test results  Airway Mallampati: II  TM Distance: >3 FB Neck ROM: Full    Dental no notable dental hx. (+) Teeth Intact, Dental Advisory Given   Pulmonary neg pulmonary ROS,    Pulmonary exam normal breath sounds clear to auscultation       Cardiovascular negative cardio ROS Normal cardiovascular exam Rhythm:Regular Rate:Normal     Neuro/Psych  Headaches, PSYCHIATRIC DISORDERS Anxiety Depression    GI/Hepatic Neg liver ROS, GERD  ,  Endo/Other  negative endocrine ROS  Renal/GU negative Renal ROS  negative genitourinary   Musculoskeletal negative musculoskeletal ROS (+)   Abdominal   Peds  Hematology negative hematology ROS (+)   Anesthesia Other Findings   Reproductive/Obstetrics                            Anesthesia Physical Anesthesia Plan  ASA: II  Anesthesia Plan: General   Post-op Pain Management:    Induction: Intravenous  PONV Risk Score and Plan: 3 and Ondansetron, Dexamethasone, Midazolam and Scopolamine patch - Pre-op  Airway Management Planned: LMA  Additional Equipment:   Intra-op Plan:   Post-operative Plan: Extubation in OR  Informed Consent: I have reviewed the patients History and Physical, chart, labs and discussed the procedure including the risks, benefits and alternatives for the proposed anesthesia with the patient or authorized representative who has indicated his/her understanding and acceptance.     Dental advisory given  Plan Discussed with: CRNA  Anesthesia Plan Comments:        Anesthesia Quick Evaluation

## 2020-06-24 NOTE — Progress Notes (Signed)
COVID Vaccine Completed: Yes Date COVID Vaccine completed: X2 COVID vaccine manufacturer:     Moderna     PCP - N/A Cardiologist - N/A  Chest x-ray - N/A EKG - N/A Stress Test - N/A ECHO - N/A Cardiac Cath - N/A Pacemaker/ICD device last checked:N/A  Sleep Study - N/A CPAP - N/A  Fasting Blood Sugar - N/A Checks Blood Sugar _N/A____ times a day  Blood Thinner Instructions:N/A Aspirin Instructions:N/A Last Dose:N/A  Anesthesia review: N/A  Patient denies shortness of breath, fever, cough and chest pain at PAT appointment   Patient verbalized understanding of instructions that were given to them at the PAT appointment. Patient was also instructed that they will need to review over the PAT instructions again at home before surgery.

## 2020-06-25 ENCOUNTER — Ambulatory Visit (HOSPITAL_COMMUNITY): Payer: 59 | Admitting: Anesthesiology

## 2020-06-25 ENCOUNTER — Encounter (HOSPITAL_COMMUNITY)
Admission: RE | Disposition: A | Discharge status: Home / Self Care | Payer: Self-pay | Source: Home / Self Care | Attending: Urology

## 2020-06-25 ENCOUNTER — Ambulatory Visit (HOSPITAL_COMMUNITY): Payer: 59

## 2020-06-25 ENCOUNTER — Ambulatory Visit (HOSPITAL_COMMUNITY)
Admission: RE | Admit: 2020-06-25 | Discharge: 2020-06-25 | Disposition: A | Discharge status: Home / Self Care | Payer: 59 | Attending: Urology | Admitting: Urology

## 2020-06-25 ENCOUNTER — Encounter (HOSPITAL_COMMUNITY): Payer: Self-pay | Admitting: Urology

## 2020-06-25 DIAGNOSIS — Z20822 Contact with and (suspected) exposure to covid-19: Secondary | ICD-10-CM | POA: Diagnosis not present

## 2020-06-25 DIAGNOSIS — Z793 Long term (current) use of hormonal contraceptives: Secondary | ICD-10-CM | POA: Insufficient documentation

## 2020-06-25 DIAGNOSIS — Z79899 Other long term (current) drug therapy: Secondary | ICD-10-CM | POA: Diagnosis not present

## 2020-06-25 DIAGNOSIS — N202 Calculus of kidney with calculus of ureter: Secondary | ICD-10-CM | POA: Diagnosis present

## 2020-06-25 DIAGNOSIS — N132 Hydronephrosis with renal and ureteral calculous obstruction: Secondary | ICD-10-CM | POA: Insufficient documentation

## 2020-06-25 HISTORY — DX: Gastro-esophageal reflux disease without esophagitis: K21.9

## 2020-06-25 HISTORY — PX: CYSTOSCOPY WITH RETROGRADE PYELOGRAM, URETEROSCOPY AND STENT PLACEMENT: SHX5789

## 2020-06-25 HISTORY — DX: Personal history of urinary calculi: Z87.442

## 2020-06-25 HISTORY — DX: Migraine, unspecified, not intractable, without status migrainosus: G43.909

## 2020-06-25 HISTORY — DX: Anemia, unspecified: D64.9

## 2020-06-25 LAB — RESP PANEL BY RT-PCR (FLU A&B, COVID) ARPGX2
Influenza A by PCR: NEGATIVE
Influenza B by PCR: NEGATIVE
SARS Coronavirus 2 by RT PCR: NEGATIVE

## 2020-06-25 LAB — PREGNANCY, URINE: Preg Test, Ur: NEGATIVE

## 2020-06-25 SURGERY — CYSTOURETEROSCOPY, WITH RETROGRADE PYELOGRAM AND STENT INSERTION
Anesthesia: General | Site: Ureter | Laterality: Bilateral

## 2020-06-25 MED ORDER — FENTANYL CITRATE (PF) 100 MCG/2ML IJ SOLN
INTRAMUSCULAR | Status: AC
  Filled 2020-06-25: qty 2

## 2020-06-25 MED ORDER — LIDOCAINE 2% (20 MG/ML) 5 ML SYRINGE
INTRAMUSCULAR | Status: DC | PRN
  Administered 2020-06-25: 100 mg via INTRAVENOUS

## 2020-06-25 MED ORDER — SODIUM CHLORIDE 0.9 % IR SOLN
Status: DC | PRN
  Administered 2020-06-25: 1000 mL

## 2020-06-25 MED ORDER — SENNOSIDES-DOCUSATE SODIUM 8.6-50 MG PO TABS: 1.0000 | ORAL_TABLET | Freq: Two times a day (BID) | ORAL | 0 refills | Status: DC

## 2020-06-25 MED ORDER — PROPOFOL 10 MG/ML IV BOLUS
INTRAVENOUS | Status: AC
  Filled 2020-06-25: qty 20

## 2020-06-25 MED ORDER — FENTANYL CITRATE (PF) 100 MCG/2ML IJ SOLN
INTRAMUSCULAR | Status: DC | PRN
  Administered 2020-06-25: 50 ug via INTRAVENOUS
  Administered 2020-06-25: 25 ug via INTRAVENOUS
  Administered 2020-06-25 (×2): 50 ug via INTRAVENOUS
  Administered 2020-06-25: 25 ug via INTRAVENOUS

## 2020-06-25 MED ORDER — DEXAMETHASONE SODIUM PHOSPHATE 10 MG/ML IJ SOLN
INTRAMUSCULAR | Status: DC | PRN
  Administered 2020-06-25: 10 mg via INTRAVENOUS

## 2020-06-25 MED ORDER — LIDOCAINE 2% (20 MG/ML) 5 ML SYRINGE
INTRAMUSCULAR | Status: AC
  Filled 2020-06-25: qty 5

## 2020-06-25 MED ORDER — KETOROLAC TROMETHAMINE 10 MG PO TABS: 10.0000 mg | ORAL_TABLET | Freq: Three times a day (TID) | ORAL | 0 refills | Status: DC | PRN

## 2020-06-25 MED ORDER — ONDANSETRON HCL 4 MG/2ML IJ SOLN
INTRAMUSCULAR | Status: DC | PRN
  Administered 2020-06-25: 4 mg via INTRAVENOUS

## 2020-06-25 MED ORDER — OXYCODONE-ACETAMINOPHEN 5-325 MG PO TABS
1.0000 | ORAL_TABLET | Freq: Four times a day (QID) | ORAL | 0 refills | Status: DC | PRN
Start: 2020-06-25 — End: 2020-07-01

## 2020-06-25 MED ORDER — ACETAMINOPHEN 500 MG PO TABS
1000.0000 mg | ORAL_TABLET | Freq: Once | ORAL | Status: AC
  Administered 2020-06-25: 1000 mg via ORAL
  Filled 2020-06-25: qty 2

## 2020-06-25 MED ORDER — SODIUM CHLORIDE 0.9 % IR SOLN
Status: DC | PRN
  Administered 2020-06-25: 6000 mL

## 2020-06-25 MED ORDER — PROPOFOL 10 MG/ML IV BOLUS
INTRAVENOUS | Status: DC | PRN
  Administered 2020-06-25: 200 mg via INTRAVENOUS

## 2020-06-25 MED ORDER — MIDAZOLAM HCL 5 MG/5ML IJ SOLN
INTRAMUSCULAR | Status: DC | PRN
  Administered 2020-06-25: 2 mg via INTRAVENOUS

## 2020-06-25 MED ORDER — ONDANSETRON HCL 4 MG/2ML IJ SOLN
INTRAMUSCULAR | Status: AC
  Filled 2020-06-25: qty 2

## 2020-06-25 MED ORDER — MIDAZOLAM HCL 2 MG/2ML IJ SOLN
INTRAMUSCULAR | Status: AC
  Filled 2020-06-25: qty 2

## 2020-06-25 MED ORDER — GENTAMICIN SULFATE 40 MG/ML IJ SOLN
5.0000 mg/kg | Freq: Once | INTRAVENOUS | Status: AC
  Administered 2020-06-25: 330 mg via INTRAVENOUS
  Filled 2020-06-25: qty 8.25

## 2020-06-25 MED ORDER — ORAL CARE MOUTH RINSE: 15.0000 mL | Freq: Once | OROMUCOSAL | Status: AC

## 2020-06-25 MED ORDER — DEXAMETHASONE SODIUM PHOSPHATE 10 MG/ML IJ SOLN
INTRAMUSCULAR | Status: AC
  Filled 2020-06-25: qty 1

## 2020-06-25 MED ORDER — CHLORHEXIDINE GLUCONATE 0.12 % MT SOLN
15.0000 mL | Freq: Once | OROMUCOSAL | Status: AC
  Administered 2020-06-25: 15 mL via OROMUCOSAL

## 2020-06-25 MED ORDER — LACTATED RINGERS IV SOLN: INTRAVENOUS | Status: DC

## 2020-06-25 MED ORDER — SCOPOLAMINE 1 MG/3DAYS TD PT72
MEDICATED_PATCH | TRANSDERMAL | Status: AC
  Filled 2020-06-25: qty 1

## 2020-06-25 MED ORDER — FENTANYL CITRATE (PF) 100 MCG/2ML IJ SOLN
25.0000 ug | INTRAMUSCULAR | Status: DC | PRN
  Administered 2020-06-25: 25 ug via INTRAVENOUS

## 2020-06-25 MED ORDER — IOHEXOL 300 MG/ML  SOLN
INTRAMUSCULAR | Status: DC | PRN
  Administered 2020-06-25: 22 mL

## 2020-06-25 MED ORDER — SCOPOLAMINE 1 MG/3DAYS TD PT72
1.0000 | MEDICATED_PATCH | TRANSDERMAL | Status: DC
  Administered 2020-06-25: 1.5 mg via TRANSDERMAL

## 2020-06-25 MED ORDER — KETOROLAC TROMETHAMINE 10 MG PO TABS
10.0000 mg | ORAL_TABLET | Freq: Once | ORAL | Status: AC
  Administered 2020-06-25: 10 mg via ORAL
  Filled 2020-06-25: qty 1

## 2020-06-25 SURGICAL SUPPLY — 22 items
BAG URO CATCHER STRL LF (MISCELLANEOUS) ×3 IMPLANT
BASKET LASER NITINOL 1.9FR (BASKET) ×3 IMPLANT
CATH INTERMIT  6FR 70CM (CATHETERS) ×3 IMPLANT
CLOTH BEACON ORANGE TIMEOUT ST (SAFETY) ×3 IMPLANT
EXTRACTOR STONE 1.7FRX115CM (UROLOGICAL SUPPLIES) IMPLANT
GLOVE BIOGEL M STRL SZ7.5 (GLOVE) ×3 IMPLANT
GOWN STRL REUS W/TWL LRG LVL3 (GOWN DISPOSABLE) ×3 IMPLANT
GUIDEWIRE ANG ZIPWIRE 038X150 (WIRE) ×3 IMPLANT
GUIDEWIRE STR DUAL SENSOR (WIRE) ×3 IMPLANT
KIT TURNOVER KIT A (KITS) IMPLANT
LASER FIB FLEXIVA PULSE ID 365 (Laser) IMPLANT
MANIFOLD NEPTUNE II (INSTRUMENTS) ×3 IMPLANT
PACK CYSTO (CUSTOM PROCEDURE TRAY) ×3 IMPLANT
SHEATH URETERAL 12FRX28CM (UROLOGICAL SUPPLIES) ×3 IMPLANT
SHEATH URETERAL 12FRX35CM (MISCELLANEOUS) IMPLANT
STENT POLARIS 5FRX22 (STENTS) ×6 IMPLANT
TRACTIP FLEXIVA PULS ID 200XHI (Laser) IMPLANT
TRACTIP FLEXIVA PULSE ID 200 (Laser)
TUBE FEEDING 8FR 16IN STR KANG (MISCELLANEOUS) ×3 IMPLANT
TUBING CONNECTING 10 (TUBING) ×2 IMPLANT
TUBING CONNECTING 10' (TUBING) ×1
TUBING UROLOGY SET (TUBING) ×3 IMPLANT

## 2020-06-25 NOTE — Brief Op Note (Signed)
06/25/2020  10:01 AM  PATIENT:  Michaela Crane  29 y.o. female  PRE-OPERATIVE DIAGNOSIS:  RIGT URETERAL STONE   BILATERAL RENAL STONE  POST-OPERATIVE DIAGNOSIS:  right ureteral stone bilateralureteral stone  PROCEDURE:  Procedure(s): CYSTOSCOPY WITH BILATERAL  RETROGRADE PYELOGRAM,right  URETEROSCOPY  basket extraction stone AND left diagnostic ureteroscopy      and bilateral STENT PLACEMENT (Bilateral)  SURGEON:  Surgeon(s) and Role:    Sebastian Ache, MD - Primary  PHYSICIAN ASSISTANT:   ASSISTANTS: none   ANESTHESIA:   general  EBL:  55mL   BLOOD ADMINISTERED:none  DRAINS: none   LOCAL MEDICATIONS USED:  NONE  SPECIMEN:  Source of Specimen:  Rt ureteral stone  DISPOSITION OF SPECIMEN:  [facility] for compositional analysis  COUNTS:  YES  TOURNIQUET:  * No tourniquets in log *  DICTATION: .Other Dictation: Dictation Number  [id number]  PLAN OF CARE: Discharge to home after PACU  PATIENT DISPOSITION:  PACU - hemodynamically stable.   Delay start of Pharmacological VTE agent (>24hrs) due to surgical blood loss or risk of bleeding: yes

## 2020-06-25 NOTE — Anesthesia Procedure Notes (Signed)
Procedure Name: LMA Insertion Date/Time: 06/25/2020 9:18 AM Performed by: Quamaine Webb D, CRNA Pre-anesthesia Checklist: Patient identified, Emergency Drugs available, Suction available and Patient being monitored Patient Re-evaluated:Patient Re-evaluated prior to induction Oxygen Delivery Method: Circle system utilized Preoxygenation: Pre-oxygenation with 100% oxygen Induction Type: IV induction Ventilation: Mask ventilation without difficulty LMA: LMA inserted LMA Size: 4.0 Tube type: Oral Number of attempts: 1 Placement Confirmation: positive ETCO2 and breath sounds checked- equal and bilateral Tube secured with: Tape Dental Injury: Teeth and Oropharynx as per pre-operative assessment

## 2020-06-25 NOTE — H&P (Signed)
Michaela Crane is an 29 y.o. female.    Chief Complaint: Pre-Op BILATERAL Ureteroscopic Stone Manipulation  HPI:   1 - Right Ureteral / Bilateral Renal Stones - 30mm Rt proxoimal ureteral stone with mild hydro and bilateral punctate renal stones by ER CT 06/2020 on eval Rt flank pain. Ureteral stone at level of upper L5 transverse process, NOT seen on scout images. UA without infectious parameters. Cr <1. Given trial of medical therapy but no passage in over a week. She desires future elective pregnancies. FU KUB 11/19 Rt stones not seen.   PMH sig for migraine/topomax, obesity. No prior surgeries. NO CV disese / blood thinners. She works at [employer] in [employer].   Today "Michaela Crane" is seen to proceed with BILATERAL ureteroscoy with goal of stone free. No interval fevers, C19 screen negative, UPreg negative.    Past Medical History:  Diagnosis Date  . Anemia   . Anxiety   . Depression   . GERD (gastroesophageal reflux disease)   . History of kidney stones   . Migraines   . Obesity 01/2019    History reviewed. No pertinent surgical history.  Family History  Problem Relation Age of Onset  . Healthy Mother   . Healthy Father    Social History:  reports that she has never smoked. She has never used smokeless tobacco. She reports current alcohol use. She reports that she does not use drugs.  Allergies: No Known Allergies  Medications Prior to Admission  Medication Sig Dispense Refill  . ESTARYLLA 0.25-35 MG-MCG tablet Take 1 tablet by mouth daily.    Marland Kitchen HYDROcodone-acetaminophen (NORCO/VICODIN) 5-325 MG tablet Take 1 tablet by mouth every 6 (six) hours as needed for severe pain. (Patient taking differently: Take 1 tablet by mouth every 8 (eight) hours as needed for moderate pain or severe pain. ) 10 tablet 0  . naproxen (NAPROSYN) 500 MG tablet Take 1 tablet (500 mg total) by mouth 2 (two) times daily with a meal. As needed for pain (Patient taking differently: Take 500 mg by mouth  2 (two) times daily with a meal. ) 60 tablet 2  . topiramate (TOPAMAX) 100 MG tablet Take 1 tablet (100 mg total) by mouth daily. 30 tablet 11  . acetaminophen (TYLENOL) 325 MG tablet Take 2 tablets (650 mg total) by mouth every 6 (six) hours as needed. (Patient not taking: Reported on 06/24/2020) 30 tablet 0  . benzonatate (TESSALON PERLES) 100 MG capsule Take 1 capsule (100 mg total) by mouth 3 (three) times daily as needed. (Patient not taking: Reported on 04/01/2020) 20 capsule 0  . cetirizine (ZYRTEC) 10 MG tablet Take 1 tablet (10 mg total) by mouth daily. (Patient not taking: Reported on 07/07/2019) 30 tablet 0  . fluticasone (FLONASE) 50 MCG/ACT nasal spray Place 1 spray into both nostrils daily. (Patient not taking: Reported on 06/24/2020) 11.1 mL 0  . Menthol (CEPACOL SORE THROAT) 5.4 MG LOZG Use as directed 1 lozenge (5.4 mg total) in the mouth or throat every 2 (two) hours as needed. (Patient not taking: Reported on 04/01/2020) 30 lozenge 0  . promethazine (PHENERGAN) 25 MG suppository Place 1 suppository (25 mg total) rectally every 6 (six) hours as needed for nausea or vomiting. (Patient not taking: Reported on 07/07/2019) 12 each 0  . SUMAtriptan (IMITREX) 100 MG tablet Take 1 tablet (100 mg total) by mouth once as needed for up to 1 dose for migraine. May repeat in 2 hours if headache persists or recurs. (Patient  not taking: Reported on 04/01/2020) 9 tablet 2    Results for orders placed or performed during the hospital encounter of 06/25/20 (from the past 48 hour(s))  Resp Panel by RT-PCR (Flu A&B, Covid) Nasopharyngeal Swab     Status: None   Collection Time: 06/25/20  6:34 AM   Specimen: Nasopharyngeal Swab; Nasopharyngeal(NP) swabs in vial transport medium  Result Value Ref Range   SARS Coronavirus 2 by RT PCR NEGATIVE NEGATIVE    Comment: (NOTE) SARS-CoV-2 target nucleic acids are NOT DETECTED.  The SARS-CoV-2 RNA is generally detectable in upper respiratory specimens during the  acute phase of infection. The lowest concentration of SARS-CoV-2 viral copies this assay can detect is 138 copies/mL. A negative result does not preclude SARS-Cov-2 infection and should not be used as the sole basis for treatment or other patient management decisions. A negative result may occur with  improper specimen collection/handling, submission of specimen other than nasopharyngeal swab, presence of viral mutation(s) within the areas targeted by this assay, and inadequate number of viral copies(<138 copies/mL). A negative result must be combined with clinical observations, patient history, and epidemiological information. The expected result is Negative.  Fact Sheet for Patients:  BloggerCourse.com  Fact Sheet for Healthcare Providers:  SeriousBroker.it  This test is no t yet approved or cleared by the [place] FDA and  has been authorized for detection and/or diagnosis of SARS-CoV-2 by FDA under an Emergency Use Authorization (EUA). This EUA will remain  in effect (meaning this test can be used) for the duration of the COVID-19 declaration under Section 564(b)(1) of the Act, 21 U.S.C.section 360bbb-3(b)(1), unless the authorization is terminated  or revoked sooner.       Influenza A by PCR NEGATIVE NEGATIVE   Influenza B by PCR NEGATIVE NEGATIVE    Comment: (NOTE) The Xpert Xpress SARS-CoV-2/FLU/RSV plus assay is intended as an aid in the diagnosis of influenza from Nasopharyngeal swab specimens and should not be used as a sole basis for treatment. Nasal washings and aspirates are unacceptable for Xpert Xpress SARS-CoV-2/FLU/RSV testing.  Fact Sheet for Patients: BloggerCourse.com  Fact Sheet for Healthcare Providers: SeriousBroker.it  This test is not yet approved or cleared by the [place] FDA and has been authorized for detection and/or diagnosis of  SARS-CoV-2 by FDA under an Emergency Use Authorization (EUA). This EUA will remain in effect (meaning this test can be used) for the duration of the COVID-19 declaration under Section 564(b)(1) of the Act, 21 U.S.C. section 360bbb-3(b)(1), unless the authorization is terminated or revoked.  Performed at [facility], [address], [place], [place] [address]   Pregnancy, urine     Status: None   Collection Time: 06/25/20  6:36 AM  Result Value Ref Range   Preg Test, Ur NEGATIVE NEGATIVE    Comment:        THE SENSITIVITY OF THIS METHODOLOGY IS >20 mIU/mL. Performed at [facility], [address], [place], [place] [address]    No results found.  Review of Systems  Constitutional: Negative for chills and fever.  Genitourinary: Positive for flank pain.  All other systems reviewed and are negative.   Height 5\' 3"  (1.6 m), weight 88 kg, last menstrual period 06/13/2020. Physical Exam Vitals reviewed.  HENT:     Head: Normocephalic.     Nose: Nose normal.  Eyes:     Pupils: Pupils are equal, round, and reactive to light.  Cardiovascular:     Rate and Rhythm:  Normal rate.  Pulmonary:     Effort: Pulmonary effort is normal.  Abdominal:     General: Abdomen is flat.  Genitourinary:    Comments: Mild Rt CVAT at present.  Musculoskeletal:        General: Normal range of motion.     Cervical back: Normal range of motion.  Skin:    General: Skin is warm.  Neurological:     General: No focal deficit present.     Mental Status: She is alert.  Psychiatric:        Mood and Affect: Mood normal.      Assessment/Plan  Proceed as planned with BILATERAL ureteroscopic stone manipulation. Risks, benefits, alternatives, expected peri-op course (including need for temporary stents given bilateral surgery) discussed previously and reiterated today.   Sebastian Ache, MD 06/25/2020, 7:21 AM

## 2020-06-25 NOTE — Discharge Instructions (Signed)
General Anesthesia, Adult, Care After This sheet gives you information about how to care for yourself after your procedure. Your health care provider may also give you more specific instructions. If you have problems or questions, contact your health care provider. What can I expect after the procedure? After the procedure, the following side effects are common:  Pain or discomfort at the IV site.  Nausea.  Vomiting.  Sore throat.  Trouble concentrating.  Feeling cold or chills.  Weak or tired.  Sleepiness and fatigue.  Soreness and body aches. These side effects can affect parts of the body that were not involved in surgery. Follow these instructions at home:  For at least 24 hours after the procedure:  Have a responsible adult stay with you. It is important to have someone help care for you until you are awake and alert.  Rest as needed.  Do not: ? Participate in activities in which you could fall or become injured. ? Drive. ? Use heavy machinery. ? Drink alcohol. ? Take sleeping pills or medicines that cause drowsiness. ? Make important decisions or sign legal documents. ? Take care of children on your own. Eating and drinking  Follow any instructions from your health care provider about eating or drinking restrictions.  When you feel hungry, start by eating small amounts of foods that are soft and easy to digest (bland), such as toast. Gradually return to your regular diet.  Drink enough fluid to keep your urine pale yellow.  If you vomit, rehydrate by drinking water, juice, or clear broth. General instructions  If you have sleep apnea, surgery and certain medicines can increase your risk for breathing problems. Follow instructions from your health care provider about wearing your sleep device: ? Anytime you are sleeping, including during daytime naps. ? While taking prescription pain medicines, sleeping medicines, or medicines that make you drowsy.  Return to  your normal activities as told by your health care provider. Ask your health care provider what activities are safe for you.  Take over-the-counter and prescription medicines only as told by your health care provider.  If you smoke, do not smoke without supervision.  Keep all follow-up visits as told by your health care provider. This is important. Contact a health care provider if:  You have nausea or vomiting that does not get better with medicine.  You cannot eat or drink without vomiting.  You have pain that does not get better with medicine.  You are unable to pass urine.  You develop a skin rash.  You have a fever.  You have redness around your IV site that gets worse. Get help right away if:  You have difficulty breathing.  You have chest pain.  You have blood in your urine or stool, or you vomit blood. Summary  After the procedure, it is common to have a sore throat or nausea. It is also common to feel tired.  Have a responsible adult stay with you for the first 24 hours after general anesthesia. It is important to have someone help care for you until you are awake and alert.  When you feel hungry, start by eating small amounts of foods that are soft and easy to digest (bland), such as toast. Gradually return to your regular diet.  Drink enough fluid to keep your urine pale yellow.  Return to your normal activities as told by your health care provider. Ask your health care provider what activities are safe for you. This information is not   intended to replace advice given to you by your health care provider. Make sure you discuss any questions you have with your health care provider. Document Revised: 07/26/2017 Document Reviewed: 03/08/2017 Elsevier Patient Education  2020 [employer]vier Inc. 1 - You may have urinary urgency (bladder spasms) and bloody urine on / off with stent in place. This is normal.  2 - Call MD or go to ER for fever >102, severe pain / nausea /  vomiting not relieved by medications, or acute change in medical status

## 2020-06-25 NOTE — Anesthesia Postprocedure Evaluation (Signed)
Anesthesia Post Note  Patient: Michaela Crane  Procedure(s) Performed: CYSTOSCOPY WITH BILATERAL  RETROGRADE PYELOGRAM,right  URETEROSCOPY  basket extraction stone AND left diagnostic ureteroscopy      and bilateral STENT PLACEMENT (Bilateral Ureter)     Patient location during evaluation: PACU Anesthesia Type: General Level of consciousness: awake and alert Pain management: pain level controlled Vital Signs Assessment: post-procedure vital signs reviewed and stable Respiratory status: spontaneous breathing, nonlabored ventilation, respiratory function stable and patient connected to nasal cannula oxygen Cardiovascular status: blood pressure returned to baseline and stable Postop Assessment: no apparent nausea or vomiting Anesthetic complications: no   No complications documented.  Last Vitals:  Vitals:   06/25/20 1045 06/25/20 1100  BP: 116/70 114/70  Pulse: 77 76  Resp: 20 18  Temp:    SpO2: 96% 95%    Last Pain:  Vitals:   06/25/20 1100  TempSrc:   PainSc: Asleep                 [name] [name]

## 2020-06-25 NOTE — Transfer of Care (Signed)
Immediate Anesthesia Transfer of Care Note  Patient: Michaela Crane  Procedure(s) Performed: CYSTOSCOPY WITH BILATERAL  RETROGRADE PYELOGRAM,right  URETEROSCOPY  basket extraction stone AND left diagnostic ureteroscopy      and bilateral STENT PLACEMENT (Bilateral Ureter)  Patient Location: PACU  Anesthesia Type:General  Level of Consciousness: awake, alert  and oriented  Airway & Oxygen Therapy: Patient Spontanous Breathing and Patient connected to face mask oxygen  Post-op Assessment: Report given to RN and Post -op Vital signs reviewed and stable  Post vital signs: Reviewed and stable  Last Vitals:  Vitals Value Taken Time  BP 118/71 06/25/20 1009  Temp    Pulse 102 06/25/20 1011  Resp 26 06/25/20 1011  SpO2 100 % 06/25/20 1011  Vitals shown include unvalidated device data.  Last Pain:  Vitals:   06/25/20 0719  TempSrc: Oral  PainSc: 0-No pain      Patients Stated Pain Goal: 3 (06/25/20 0719)  Complications: No complications documented.

## 2020-06-26 ENCOUNTER — Encounter (HOSPITAL_COMMUNITY): Payer: Self-pay | Admitting: Urology

## 2020-06-27 NOTE — Op Note (Signed)
NAME: Michaela Crane, Michaela Crane MEDICAL RECORD BH:[id number] ACCOUNT [id number] DATE OF BIRTH:Apr 27, 1991 FACILITY: [facility] LOCATION: [facility] PHYSICIAN:Kaytlan Behrman, MD  OPERATIVE REPORT  DATE OF PROCEDURE:  06/25/2020  PREOPERATIVE DIAGNOSES:  Right ureteral stone, bilateral renal stones, desire for future fertility.  PROCEDURES: 1.  Cystoscopy, bilateral retrograde pyelograms interpretation, right ureteroscopy, basketing of stone. 2.  Left diagnostic ureteroscopy. 3.  Insertion of bilateral ureteral stents, 5 x 22 Polaris, no tether.  ESTIMATED BLOOD LOSS:  Nil.  COMPLICATIONS:  None.  SPECIMEN:  Right ureteral stone for analysis.  FINDINGS: 1.  Right mid ureteral stone.  This was amenable to simple grasping with the basket. 2.  Minimal volume papillary tip calcifications on the right side.  These were punctate and flushed via the access sheath. 3.  Relative narrowing of the left proximal ureter without frank stricturing, inability to pass ureteroscope to the level of the renal pelvis. 4.  Successful placement of bilateral ureteral stents, proximal end in renal pelvis, distal end in urinary bladder.  INDICATIONS:  The patient is a pleasant 29 year old woman who was found on workup for colicky flank pain to have a right mid ureteral stone and right hydronephrosis.  She was put on a trial of medical therapy; however, she has failed to pass her stone  after over a week, and colic has remained very difficult to manage.  She also has additional bilateral renal stones.  She has 1 child and does desire future fertility.  I have discussed management including continued medical therapy versus shockwave  lithotripsy versus ureteroscopy unilateral versus bilateral, and she wished to proceed with bilateral ureteroscopy with goal of stone free, both to address her acute stone and to get her stone free before she has elected pregnancies.  Informed consent  was obtained and placed in medical  record.  DESCRIPTION OF PROCEDURE:  The patient being Michaela Crane, proceeding being bilateral ureteroscopic stimulation was confirmed.  Procedure timeout was performed.  Intravenous antibiotics were administered.  General LMA anesthesia was induced.  The  patient was placed into a low lithotomy position.  Sterile field was created prepping and draping the patient's vagina, introitus, and proximal thighs using iodine.  Cystourethroscopy was performed using 21-French rigid cystoscope with offset lens.   Inspection of the urinary bladder revealed no diverticula, calcifications, papillary lesions.  The left ureteral orifice was cannulated with a 6-French renal catheter, and left retrograde pyelogram was obtained.  Left retrograde pyelogram demonstrated a single left ureter, single system left kidney.  No filling defects or narrowing noted.  A 0.03 ZIPwire was advanced to lower pole and set aside as a safety wire.  Next, right retrograde pyelogram was obtained.  Right retrograde pyelogram demonstrated a single right ureter, single system right kidney.  There was what appeared to be a mobile filling defect in the midureter consistent with known stone.  There was mild hydronephrosis above this.  A separate 0.03  ZIPwire was advanced to lower pole and set aside as a safety wire.  An 8-French feeding tube was placed in the urinary bladder for pressure release.  Semirigid ureteroscopy was then performed of the distal 4/5 of the right ureter alongside a separate  sensor working wire.  There was a location in the midureter consistent with likely prior stone impaction.  There was no stone in this site likely owing to retrograde positioning to the level of the kidney.  Next, semirigid ureteroscopy was performed of  the distal orifice left ureter alongside a separate sensor working wire.  No  significant mucosal findings were found.  Next, a 12/14 short length ureteral access sheath was placed over the right sensor  working wire to the level of the proximal ureter  using continuous fluoroscopic guidance, and flexible digital ureteroscopy was performed of the proximal ureter and systematic inspection of the right kidney including all calices x3.  There was a dominant, free floating stone within the renal pelvis and  approximately 4 mm.  This was felt it likely represented retrograde positioning of prior ureteral stone.  This was amenable to simple basketing and it was grasped in escape basket, removed and set aside for composition analysis.  There were several very  small papillary tip calcifications, which were less than 2 mm or so.  These were amenable to simple just dislodging technique with the closed escape basket.  These were dislodged and flushed out via the access sheath, thus rendering the right side stone  free.  Access sheath was removed under continuous vision.  No significant mucosal findings were found.  Next, the access sheath was placed over the left sensor working wire to the level of the proximal left ureter using continuous fluoroscopic guidance,  and flexible digital ureteroscopy was performed of the proximal left ureter.  In area approximately 2-3 cm distal to the UPJ, the ureter was relatively narrow, not without frank stricturing per se, but of a caliber that did not allow easy passage of the  ureteroscope.  A train tracking technique was then used as well placing the sensor wire once again, but again the caliber would not allow easy passage of the ureteroscope.  Most prudent means of management would be interval stenting and allow the patient  to decide whether she wished to proceed with left-sided ureteroscopy in a staged fashion after passive dilation versus removal of stent in the elective setting.  As such, the access sheath was removed under continuous vision, no significant mucosal abnormalites were found, and a new 5  x 22 Polaris-type stent was placed over remaining left safety wire using  fluoroscopic guidance.  Good proximal and distal planes were noted, and a separate 5 x 22 Polaris-type stent was placed over the right remaining safety wire using fluoroscopic  guidance.  Good proximal and distal planes were noted.  Bladder was entered per cystoscope.  Procedure was then terminated.  The patient tolerated the procedure well.  No immediate complications.  The patient was taken to postanesthesia care in stable  condition.  Plan for discharge home.  IN/NUANCE  D:06/25/2020 T:06/25/2020 JOB:[id number]

## 2020-07-01 ENCOUNTER — Other Ambulatory Visit: Payer: Self-pay | Admitting: Urology

## 2020-07-01 MED ORDER — OXYCODONE-ACETAMINOPHEN 5-325 MG PO TABS: 1.0000 | ORAL_TABLET | Freq: Four times a day (QID) | ORAL | 0 refills | Status: AC | PRN

## 2020-07-10 ENCOUNTER — Other Ambulatory Visit: Payer: Self-pay | Admitting: Physician Assistant

## 2021-02-02 ENCOUNTER — Telehealth: Payer: 59 | Admitting: Physician Assistant

## 2021-02-02 DIAGNOSIS — N760 Acute vaginitis: Secondary | ICD-10-CM

## 2021-02-02 MED ORDER — METRONIDAZOLE 0.75 % EX GEL: CUTANEOUS | 0 refills | Status: DC

## 2021-02-02 NOTE — Progress Notes (Unsigned)

## 2021-03-21 ENCOUNTER — Telehealth: Payer: Self-pay | Admitting: Physician Assistant

## 2021-03-21 NOTE — Telephone Encounter (Signed)
Spoke to the pt about the FMLA and Wynona Canes stated that shes going to fix her paperwork and ensure the pt that she has to keep her appt the pt understood and agreed

## 2021-04-03 ENCOUNTER — Encounter: Payer: Self-pay | Admitting: *Deleted

## 2021-04-28 ENCOUNTER — Encounter: Payer: 59 | Admitting: Physician Assistant

## 2021-09-08 ENCOUNTER — Encounter: Payer: Self-pay | Admitting: Physician Assistant

## 2021-09-08 ENCOUNTER — Encounter: Payer: 59 | Admitting: Physician Assistant

## 2021-09-08 ENCOUNTER — Other Ambulatory Visit: Payer: Self-pay

## 2021-09-08 ENCOUNTER — Telehealth: Payer: 59 | Admitting: Physician Assistant

## 2021-09-08 DIAGNOSIS — G43009 Migraine without aura, not intractable, without status migrainosus: Secondary | ICD-10-CM

## 2021-09-08 MED ORDER — TOPIRAMATE 100 MG PO TABS: 100.0000 mg | ORAL_TABLET | Freq: Every day | ORAL | 11 refills | Status: AC

## 2021-09-08 MED ORDER — NAPROXEN 500 MG PO TABS: ORAL_TABLET | ORAL | 2 refills | Status: DC

## 2021-09-08 MED ORDER — SUMATRIPTAN SUCCINATE 100 MG PO TABS: 100.0000 mg | ORAL_TABLET | Freq: Once | ORAL | 1 refills | Status: DC | PRN

## 2021-09-08 NOTE — Patient Instructions (Signed)
Migraine Headache A migraine headache is an intense, throbbing pain on one side or both sides of the head. Migraine headaches may also cause other symptoms, such as nausea, vomiting, and sensitivity to light and noise. A migraine headache can last from 4 hours to 3 days. Talk with your doctor about what things may bring on (trigger) your migraine headaches. What are the causes? The exact cause of this condition is not known. However, a migraine may be caused when nerves in the brain become irritated and release chemicals that cause inflammation of blood vessels. This inflammation causes pain. This condition may be triggered or caused by: Drinking alcohol. Smoking. Taking medicines, such as: Medicine used to treat chest pain (nitroglycerin). Birth control pills. Estrogen. Certain blood pressure medicines. Eating or drinking products that contain nitrates, glutamate, aspartame, or tyramine. Aged cheeses, chocolate, or caffeine may also be triggers. Doing physical activity. Other things that may trigger a migraine headache include: Menstruation. Pregnancy. Hunger. Stress. Lack of sleep or too much sleep. Weather changes. Fatigue. What increases the risk? The following factors may make you more likely to experience migraine headaches: Being a certain age. This condition is more common in people who are 25-55 years old. Being female. Having a family history of migraine headaches. Being Caucasian. Having a mental health condition, such as depression or anxiety. Being obese. What are the signs or symptoms? The main symptom of this condition is pulsating or throbbing pain. This pain may: Happen in any area of the head, such as on one side or both sides. Interfere with daily activities. Get worse with physical activity. Get worse with exposure to bright lights or loud noises. Other symptoms may include: Nausea. Vomiting. Dizziness. General sensitivity to bright lights, loud noises, or  smells. Before you get a migraine headache, you may get warning signs (an aura). An aura may include: Seeing flashing lights or having blind spots. Seeing bright spots, halos, or zigzag lines. Having tunnel vision or blurred vision. Having numbness or a tingling feeling. Having trouble talking. Having muscle weakness. Some people have symptoms after a migraine headache (postdromal phase), such as: Feeling tired. Difficulty concentrating. How is this diagnosed? A migraine headache can be diagnosed based on: Your symptoms. A physical exam. Tests, such as: CT scan or an MRI of the head. These imaging tests can help rule out other causes of headaches. Taking fluid from the spine (lumbar puncture) and analyzing it (cerebrospinal fluid analysis, or CSF analysis). How is this treated? This condition may be treated with medicines that: Relieve pain. Relieve nausea. Prevent migraine headaches. Treatment for this condition may also include: Acupuncture. Lifestyle changes like avoiding foods that trigger migraine headaches. Biofeedback. Cognitive behavioral therapy. Follow these instructions at home: Medicines Take over-the-counter and prescription medicines only as told by your health care provider. Ask your health care provider if the medicine prescribed to you: Requires you to avoid driving or using heavy machinery. Can cause constipation. You may need to take these actions to prevent or treat constipation: Drink enough fluid to keep your urine pale yellow. Take over-the-counter or prescription medicines. Eat foods that are high in fiber, such as beans, whole grains, and fresh fruits and vegetables. Limit foods that are high in fat and processed sugars, such as fried or sweet foods. Lifestyle Do not drink alcohol. Do not use any products that contain nicotine or tobacco, such as cigarettes, e-cigarettes, and chewing tobacco. If you need help quitting, ask your health care  provider. Get at least 8   hours of sleep every night. Find ways to manage stress, such as meditation, deep breathing, or yoga. General instructions   Keep a journal to find out what may trigger your migraine headaches. For example, write down: What you eat and drink. How much sleep you get. Any change to your diet or medicines. If you have a migraine headache: Avoid things that make your symptoms worse, such as bright lights. It may help to lie down in a dark, quiet room. Do not drive or use heavy machinery. Ask your health care provider what activities are safe for you while you are experiencing symptoms. Keep all follow-up visits as told by your health care provider. This is important. Contact a health care provider if: You develop symptoms that are different or more severe than your usual migraine headache symptoms. You have more than 15 headache days in one month. Get help right away if: Your migraine headache becomes severe. Your migraine headache lasts longer than 72 hours. You have a fever. You have a stiff neck. You have vision loss. Your muscles feel weak or like you cannot control them. You start to lose your balance often. You have trouble walking. You faint. You have a seizure. Summary A migraine headache is an intense, throbbing pain on one side or both sides of the head. Migraines may also cause other symptoms, such as nausea, vomiting, and sensitivity to light and noise. This condition may be treated with medicines and lifestyle changes. You may also need to avoid certain things that trigger a migraine headache. Keep a journal to find out what may trigger your migraine headaches. Contact your health care provider if you have more than 15 headache days in a month or you develop symptoms that are different or more severe than your usual migraine headache symptoms. This information is not intended to replace advice given to you by your health care provider. Make sure you  discuss any questions you have with your health care provider. Document Revised: 11/14/2018 Document Reviewed: 09/04/2018 Elsevier Patient Education  2022 Elsevier Inc.  

## 2021-09-08 NOTE — Progress Notes (Signed)
TELEHEALTH GYNECOLOGY VISIT ENCOUNTER NOTE  Provider location: Center for [facility]   Patient location: Home  I connected with Michaela Crane on 09/08/21 at  8:00 AM EST by telephone and verified that I am speaking with the correct person using two identifiers. Patient was unable to do MyChart audiovisual encounter due to technical difficulties, she tried several times.    I discussed the limitations, risks, security and privacy concerns of performing an evaluation and management service by telephone and the availability of in person appointments. I also discussed with the patient that there may be a patient responsible charge related to this service. The patient expressed understanding and agreed to proceed.   History:  Michaela Crane is a 31 y.o. for yearly headache followup.  She had stopped taking the topiramate some months ago.  She liked that her appetite returned but the headaches also got worse.  She restarted 3 weeks ago.  She was able to losing weight when she was on the Topamax.  She feels like she gained some weight back with discontinuation and she thinks she is about 210#.  She also thinks she lost weight due to stress.   When she gets a headache, she uses naproxen.  She had tried nurtec but did not get any benefit.    She reports 5-10 headache days per month, with 2-3 of those being severe.  Severe headaches are those she wakes up with.    Uses muscle relaxant infrequently because she is groggy upon wakening in the am.    She has started using contraceptive patch just this week.   No further issues with kidney stones of late.  She did discuss topamax use with her urologist when she was having all those issues.  She reports today she was assured by her urologist the topamax did not contribute to her problem and she could continue use of this medication.      Past Medical History:  Diagnosis Date   Anemia    Anxiety    Depression     GERD (gastroesophageal reflux disease)    History of kidney stones    Migraines    Obesity 01/2019   Past Surgical History:  Procedure Laterality Date   CYSTOSCOPY WITH RETROGRADE PYELOGRAM, URETEROSCOPY AND STENT PLACEMENT Bilateral 06/25/2020   Procedure: CYSTOSCOPY WITH BILATERAL  RETROGRADE PYELOGRAM,right  URETEROSCOPY  basket extraction stone AND left diagnostic ureteroscopy      and bilateral STENT PLACEMENT;  Surgeon: Sebastian Ache, MD;  Location: [facility];  Service: Urology;  Laterality: Bilateral;   The following portions of the patient's history were reviewed and updated as appropriate: allergies, current medications, past family history, past medical history, past social history, past surgical history and problem list.   Review of Systems:  Pertinent items noted in HPI and remainder of comprehensive ROS otherwise negative.  Physical Exam:   General:  Alert, oriented and cooperative.   Mental Status: Normal mood and affect perceived. Normal judgment and thought content.  Physical exam deferred due to nature of the encounter  Assessment and Plan:     1. Migraine without aura and without status migrainosus, not intractable    Will continue with the Topamax at 100mg  as she just restarted 3 weeks ago and feels like it gave benefit in terms of migraine prevention and weight control.  Pt is aware that this medication contributes to kidney stones.  She chooses to use it nonetheless.   Will continue naproxen for  acute migraine. Pt encouraged to use sumatriptan right away for the headaches she has upon awakening.   Drink lots of water.      I discussed the assessment and treatment plan with the patient. The patient was provided an opportunity to ask questions and all were answered. The patient agreed with the plan and demonstrated an understanding of the instructions.   The patient was advised to call back or seek an in-person evaluation/go to the ED if the symptoms worsen or if  the condition fails to improve as anticipated.  I provided 41 minutes of virtual time during this encounter.   Bertram Denver, PA-C Center for [employer], [facility]

## 2021-12-01 IMAGING — CT CT RENAL STONE PROTOCOL
2 of 4 series · 16 of 46 positions shown, 18 images · non-contrast
Comparison: None.

CLINICAL DATA: 28-year-old female with flank pain. Concern for
kidney stone.

EXAM:
CT ABDOMEN AND PELVIS WITHOUT CONTRAST
TECHNIQUE: Multidetector CT imaging of the abdomen and pelvis was performed
following the standard protocol without IV contrast.

[Series 2: axial st · axial · 0.74mm/px · z∈[-854,-509]mm · 13 of 77 slices shown, 15 images]
[im 4/77  soft-tissue]
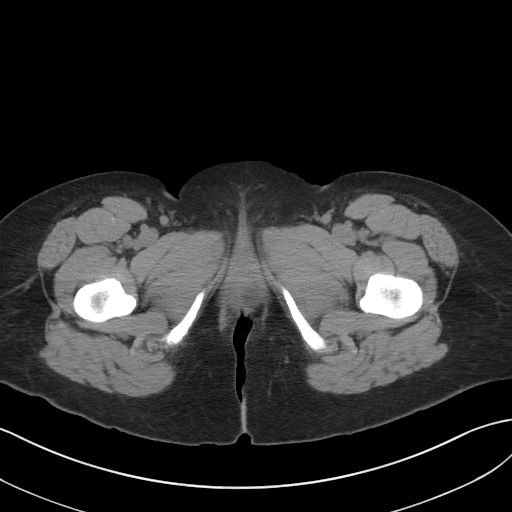
[im 4/77  bone]
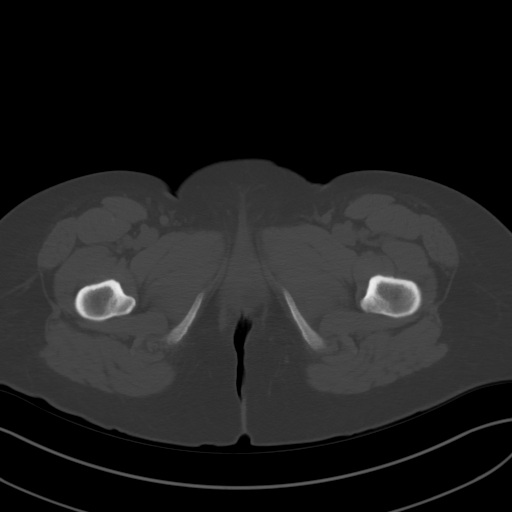
[im 10/77  soft-tissue]
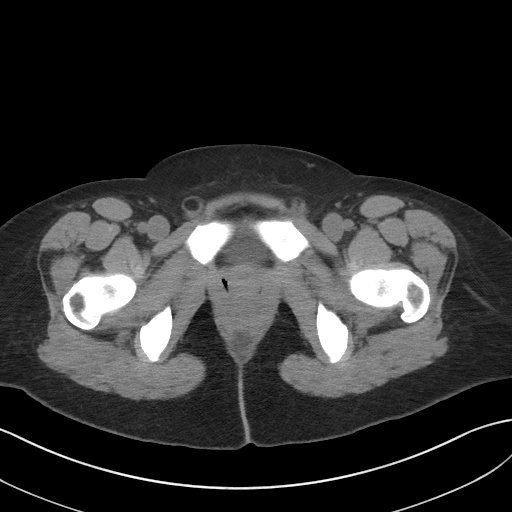
[im 16/77  soft-tissue]
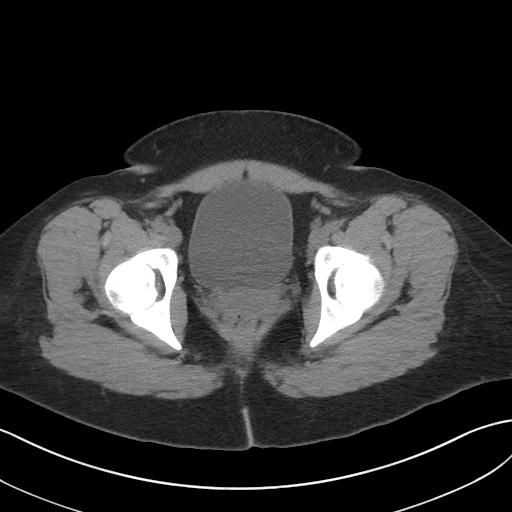
[im 23/77  soft-tissue]
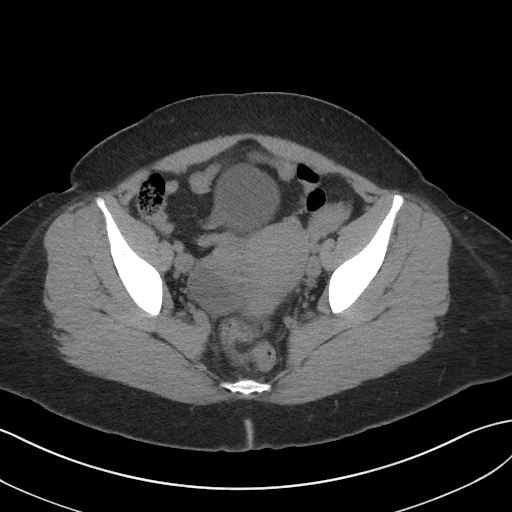
[im 26/77  soft-tissue]
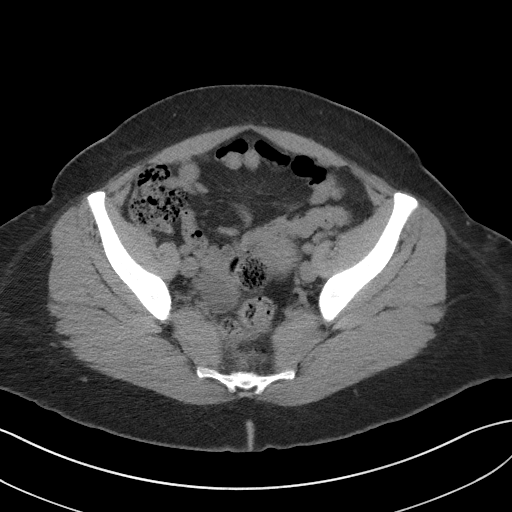
[im 32/77  soft-tissue]
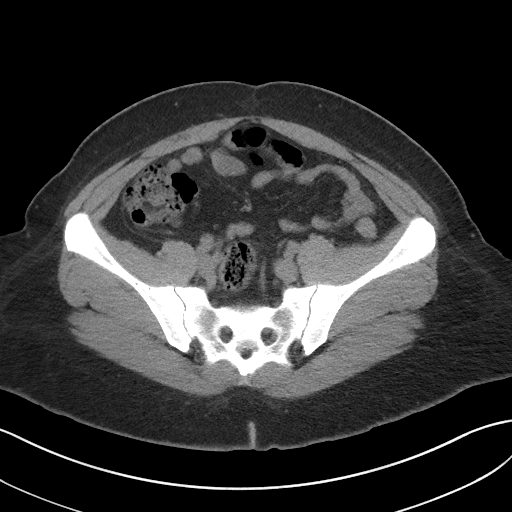
[im 39/77  soft-tissue]
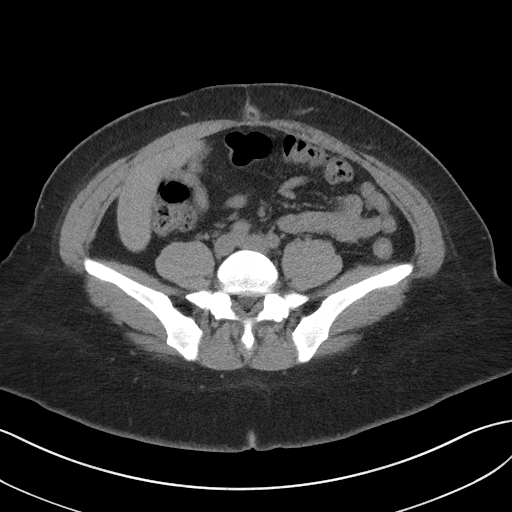
[im 45/77  soft-tissue]
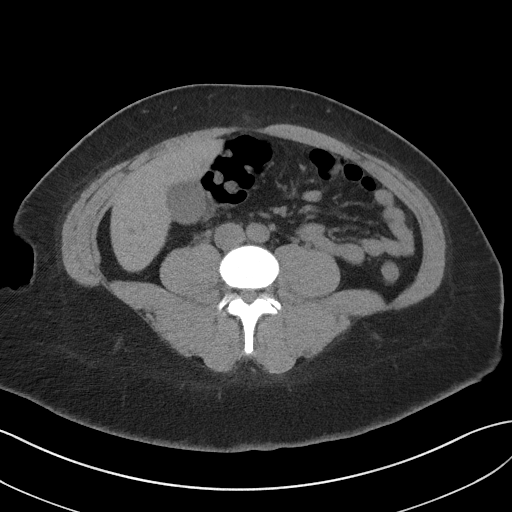
[im 51/77  soft-tissue]
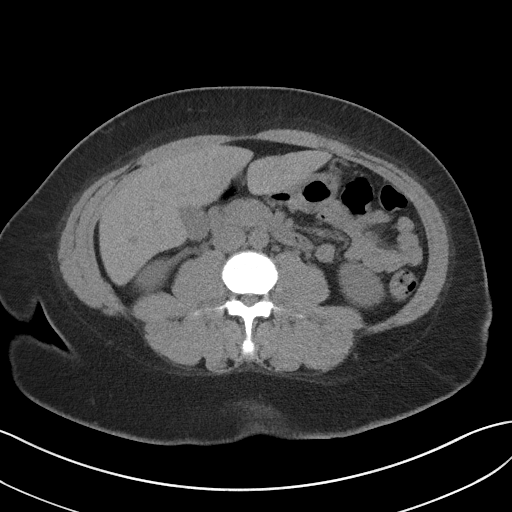
[im 51/77  bone]
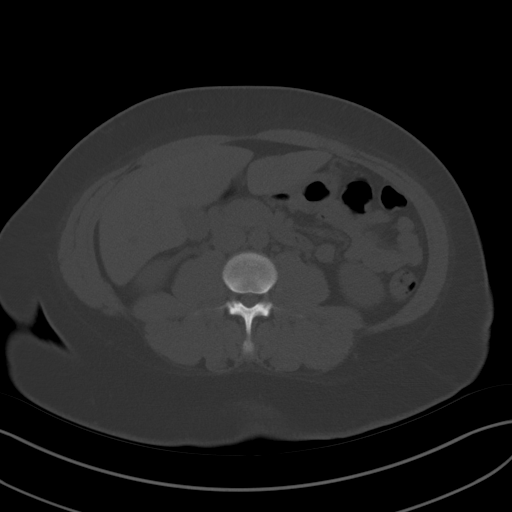
[im 54/77  soft-tissue]
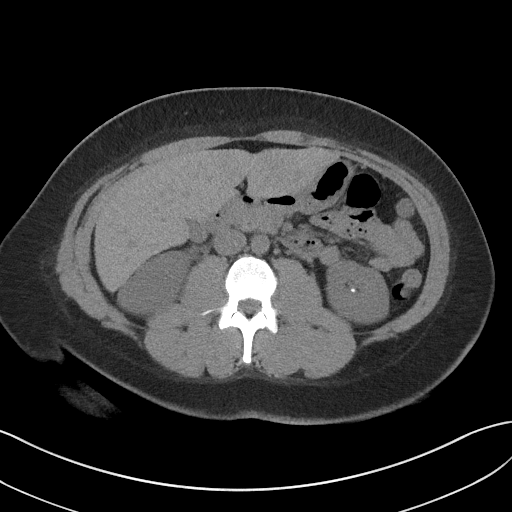
[im 61/77  soft-tissue]
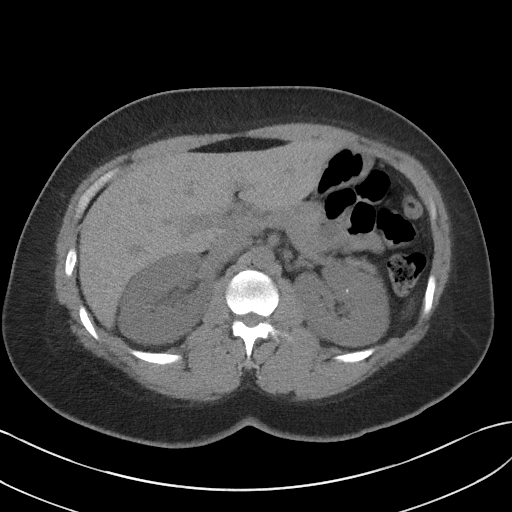
[im 67/77  soft-tissue]
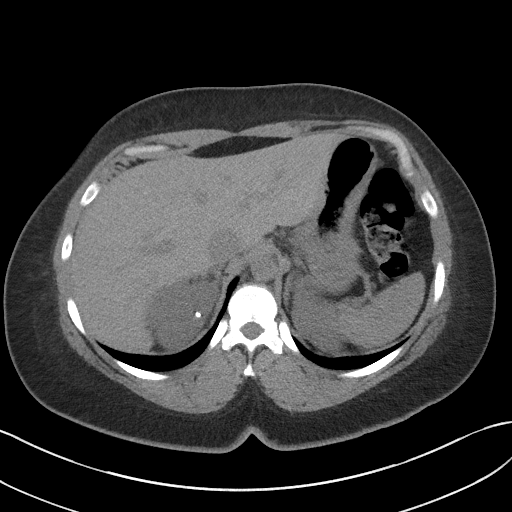
[im 73/77  soft-tissue]
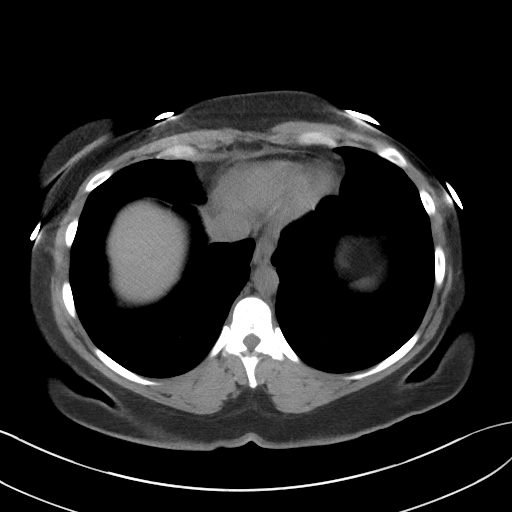

[Series 5: coronal st · coronal · 0.75mm/px · 3 of 89 slices shown]
[im 30/89  soft-tissue]
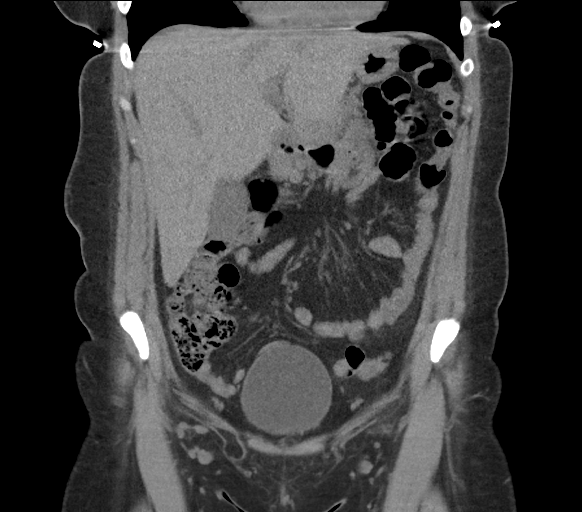
[im 40/89  soft-tissue]
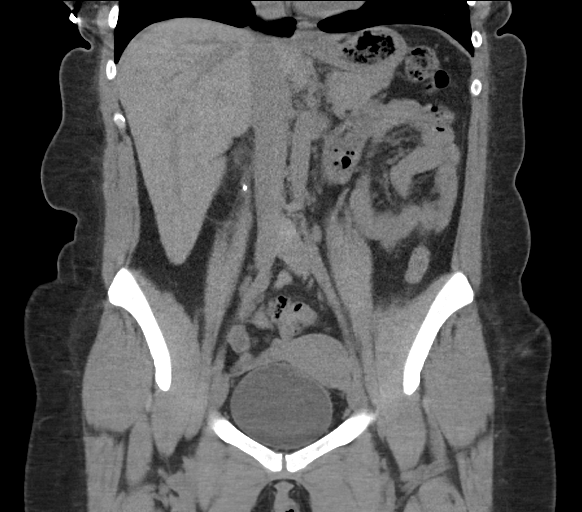
[im 49/89  soft-tissue]
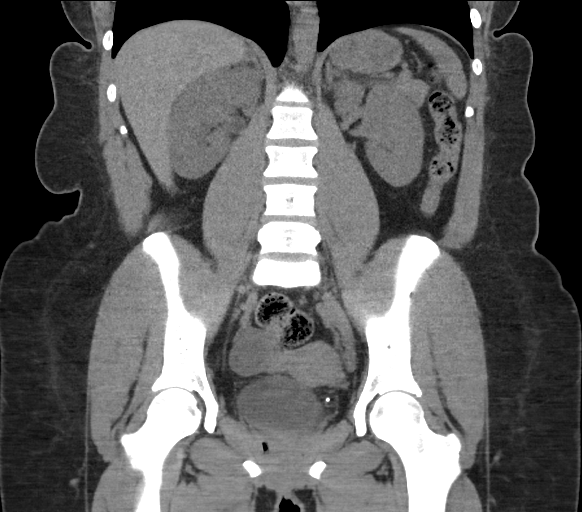

[16 of 46 positions shown; findings below may reference images not displayed]

FINDINGS: Evaluation of this exam is limited in the absence of intravenous
contrast.

Lower chest: The visualized lung bases are clear.

No intra-abdominal free air or free fluid.

Hepatobiliary: There is an ill-defined 3.2 x 2.7 cm hypodense lesion
in the left lobe of the liver (segment II B) which is not
characterized on this noncontrast CT. This may represent a
hemangioma, FNH, or adenoma given the patient's age. Other
etiologies are not excluded. Further evaluation with MRI without and
with contrast is recommended. No intrahepatic biliary ductal
dilatation. The gallbladder is unremarkable.

Pancreas: Unremarkable. No pancreatic ductal dilatation or
surrounding inflammatory changes.

Spleen: Normal in size without focal abnormality.

Adrenals/Urinary Tract: The adrenal glands unremarkable. There is a
4 mm stone in the proximal right ureter with mild right
hydronephrosis. Several additional nonobstructing bilateral renal
calculi measure up to 4 mm in the inferior pole of the left kidney.
A 4 mm stone in the upper pole of the right kidney may be within a
collecting system or calyceal diverticulum or in the renal
parenchyma. There is no hydronephrosis on the left. The left ureter
and urinary bladder appear unremarkable.

Stomach/Bowel: There is no bowel obstruction or active inflammation.
The appendix is normal.

Vascular/Lymphatic: The abdominal aorta and IVC unremarkable. No
portal venous gas. There is no adenopathy.

Reproductive: The uterus is grossly unremarkable. There is a 3 cm
right ovarian dominant cyst or follicle. The left ovary is
unremarkable.

Other: None

Musculoskeletal: No acute or significant osseous findings.
IMPRESSION: 1. A 4 mm proximal right ureteral stone with mild right
hydronephrosis. Additional nonobstructing bilateral renal calculi
noted. No hydronephrosis on the left.
2. A 3 cm right ovarian dominant cyst or follicle.
3. A 3.2 x 2.7 cm hypodense lesion in the left lobe of the liver.
Further characterization with MRI without and with contrast, on a
nonemergent/outpatient basis, is recommended.

## 2022-02-22 DIAGNOSIS — F332 Major depressive disorder, recurrent severe without psychotic features: Secondary | ICD-10-CM | POA: Diagnosis not present

## 2022-02-28 ENCOUNTER — Other Ambulatory Visit: Payer: Self-pay | Admitting: Obstetrics & Gynecology

## 2022-02-28 DIAGNOSIS — Z30011 Encounter for initial prescription of contraceptive pills: Secondary | ICD-10-CM

## 2022-02-28 MED ORDER — NORGESTIMATE-ETH ESTRADIOL 0.25-35 MG-MCG PO TABS: 1.0000 | ORAL_TABLET | Freq: Every day | ORAL | 0 refills | Status: DC

## 2022-02-28 NOTE — Progress Notes (Signed)
Rx for OCPs so she does not have a break in contraception

## 2022-03-22 DIAGNOSIS — F4323 Adjustment disorder with mixed anxiety and depressed mood: Secondary | ICD-10-CM | POA: Diagnosis not present

## 2022-03-29 ENCOUNTER — Ambulatory Visit (INDEPENDENT_AMBULATORY_CARE_PROVIDER_SITE_OTHER): Payer: BC Managed Care – PPO | Admitting: Obstetrics & Gynecology

## 2022-03-29 ENCOUNTER — Encounter: Payer: Self-pay | Admitting: Obstetrics & Gynecology

## 2022-03-29 ENCOUNTER — Other Ambulatory Visit (HOSPITAL_COMMUNITY)
Admission: RE | Admit: 2022-03-29 | Discharge: 2022-03-29 | Disposition: A | Discharge status: Home / Self Care | Payer: BC Managed Care – PPO | Source: Ambulatory Visit | Attending: Obstetrics & Gynecology | Admitting: Obstetrics & Gynecology

## 2022-03-29 VITALS — BP 109/76 | HR 69 | Ht 63.0 in | Wt 217.0 lb

## 2022-03-29 DIAGNOSIS — Z113 Encounter for screening for infections with a predominantly sexual mode of transmission: Secondary | ICD-10-CM | POA: Insufficient documentation

## 2022-03-29 DIAGNOSIS — Z87442 Personal history of urinary calculi: Secondary | ICD-10-CM | POA: Diagnosis not present

## 2022-03-29 DIAGNOSIS — N760 Acute vaginitis: Secondary | ICD-10-CM

## 2022-03-29 DIAGNOSIS — Z01419 Encounter for gynecological examination (general) (routine) without abnormal findings: Secondary | ICD-10-CM

## 2022-03-29 DIAGNOSIS — Z3041 Encounter for surveillance of contraceptive pills: Secondary | ICD-10-CM

## 2022-03-29 DIAGNOSIS — Z30011 Encounter for initial prescription of contraceptive pills: Secondary | ICD-10-CM

## 2022-03-29 MED ORDER — NORGESTIMATE-ETH ESTRADIOL 0.25-35 MG-MCG PO TABS: 1.0000 | ORAL_TABLET | Freq: Every day | ORAL | 4 refills | Status: DC

## 2022-03-29 MED ORDER — METRONIDAZOLE 0.75 % VA GEL: 1.0000 | Freq: Every day | VAGINAL | 2 refills | Status: AC

## 2022-03-29 NOTE — Addendum Note (Signed)
Addended by: Annamarie Dawley on: 03/29/2022 11:55 AM   Modules accepted: Orders

## 2022-03-29 NOTE — Progress Notes (Signed)
WELL-WOMAN EXAMINATION Patient name: Michaela Crane MRN [id number]  Date of birth: 03-31-1991 Chief Complaint:   Gynecologic Exam  History of Present Illness:   Michaela Crane is a 31 y.o. G60P1001 female being seen today for a routine well-woman exam.   Prior [facility] patient- records obtained. -h/o recurrent BV- will use Metrogel for a few days and it will resolve. []  plan to refill Metrogel to use when needed  Occasional nausea and feels hungry a lot- not sure if it's the medication or not.  Briefly discussed alternative contraception.  Previously had Mirena and Depot.  Wishes to continue with OCPs.   Patient's last menstrual period was 03/19/2022. Menses regular with 3 days of bleeding.  No issues with menses.   The current method of family planning is OCP (estrogen/progesterone).   Last pap 01/2020 normal.  Last mammogram: n/a. Last colonoscopy: n/a  History of kidney stones- pt to f/u with [facility]      03/29/2022    9:18 AM 01/08/2019    9:06 AM 12/06/2015    6:07 PM 09/29/2015    6:24 PM 07/17/2015    3:19 PM  Depression screen PHQ 2/9  Decreased Interest 0 2 0 0 1  Down, Depressed, Hopeless 1 2 0 0 3  PHQ - 2 Score 1 4 0 0 4  Altered sleeping 1    3  Tired, decreased energy 1    3  Change in appetite 1    2  Feeling bad or failure about yourself  1    0  Trouble concentrating 1    0  Moving slowly or fidgety/restless 0    2  Suicidal thoughts 0    0  PHQ-9 Score 6    14  Difficult doing work/chores     Somewhat difficult      Review of Systems:   Pertinent items are noted in HPI Denies any headaches, blurred vision, fatigue, shortness of breath, chest pain.  Denies problems with bowel movements, urination, or intercourse unless otherwise stated above.  Denies irregular discharge, itching or irritation.  Denies pelvic or abdominal pain.  Pertinent History Reviewed:  Reviewed past medical,surgical, social and family history.  Reviewed  problem list, medications and allergies. Physical Assessment:   Vitals:   03/29/22 0919  BP: 109/76  Pulse: 69  Weight: 217 lb (98.4 kg)  Height: 5\' 3"  (1.6 m)  Body mass index is 38.44 kg/m.        Physical Examination:   General appearance - well appearing, and in no distress  Mental status - alert, oriented to person, place, and time  Psych:  She has a normal mood and affect  Skin - warm and dry, normal color, no suspicious lesions noted  Chest - effort normal, all lung fields clear to auscultation bilaterally  Heart - normal rate and regular rhythm  Neck:  midline trachea, no thyromegaly or nodules  Breasts - breasts appear normal, no suspicious masses, no skin or nipple changes or  axillary nodes  Abdomen - soft, nontender, nondistended, no masses or organomegaly  Pelvic - VULVA: normal appearing vulva with no masses, tenderness or lesions  VAGINA: normal appearing vagina with normal color and discharge, no lesions  CERVIX: normal appearing cervix without discharge or lesions, no CMT  UTERUS: uterus is felt to be normal size, shape, consistency and nontender   ADNEXA: No adnexal masses or tenderness noted.  Extremities:  No swelling or varicosities noted  Chaperone:  pt  declined-      Assessment & Plan:  1) Well-Woman Exam -pap up to date -STI screening to be completed  2) Contraceptive management -doing well with OCPs and plan to continue  3) Recurrent BV -Metrogel sent in to use prn  Orders Placed This Encounter  Procedures   HIV Antibody (routine testing w rflx)   RPR    Meds:  Meds ordered this encounter  Medications   norgestimate-ethinyl estradiol (ORTHO-CYCLEN) 0.25-35 MG-MCG tablet    Sig: Take 1 tablet by mouth daily.    Dispense:  90 tablet    Refill:  4   metroNIDAZOLE (METROGEL VAGINAL) 0.75 % vaginal gel    Sig: Place 1 Applicatorful vaginally at bedtime for 5 days.    Dispense:  70 g    Refill:  2    Follow-up: Return in about 1 year  (around 03/30/2023) for Annual.   [name], DO Attending Obstetrician & Gynecologist, Faculty Practice Center for [facility], [facility]

## 2022-03-30 LAB — CERVICOVAGINAL ANCILLARY ONLY
Chlamydia: NEGATIVE
Comment: NEGATIVE
Comment: NORMAL
Neisseria Gonorrhea: NEGATIVE

## 2022-03-30 LAB — RPR: RPR Ser Ql: NONREACTIVE

## 2022-03-30 LAB — HIV ANTIBODY (ROUTINE TESTING W REFLEX): HIV Screen 4th Generation wRfx: NONREACTIVE

## 2022-04-12 ENCOUNTER — Encounter: Payer: Self-pay | Admitting: Obstetrics & Gynecology

## 2022-05-18 DIAGNOSIS — Z23 Encounter for immunization: Secondary | ICD-10-CM | POA: Diagnosis not present

## 2022-06-05 ENCOUNTER — Encounter: Payer: Self-pay | Admitting: Obstetrics & Gynecology

## 2022-06-05 ENCOUNTER — Other Ambulatory Visit: Payer: Self-pay | Admitting: Obstetrics & Gynecology

## 2022-06-05 DIAGNOSIS — Z30016 Encounter for initial prescription of transdermal patch hormonal contraceptive device: Secondary | ICD-10-CM

## 2022-06-05 MED ORDER — NORELGESTROMIN-ETH ESTRADIOL 150-35 MCG/24HR TD PTWK: 1.0000 | MEDICATED_PATCH | TRANSDERMAL | 12 refills | Status: DC

## 2022-06-05 NOTE — Progress Notes (Signed)
Rx sent in for patch- plan to change from pill to this new medication

## 2022-08-08 ENCOUNTER — Encounter: Payer: Self-pay | Admitting: Family

## 2022-08-08 ENCOUNTER — Ambulatory Visit (INDEPENDENT_AMBULATORY_CARE_PROVIDER_SITE_OTHER): Payer: BC Managed Care – PPO | Admitting: Family

## 2022-08-08 VITALS — BP 110/68 | HR 79 | Temp 97.5°F | Ht 63.0 in | Wt 237.4 lb

## 2022-08-08 DIAGNOSIS — G43009 Migraine without aura, not intractable, without status migrainosus: Secondary | ICD-10-CM | POA: Diagnosis not present

## 2022-08-08 DIAGNOSIS — R590 Localized enlarged lymph nodes: Secondary | ICD-10-CM

## 2022-08-08 NOTE — Progress Notes (Signed)
New Patient Office Visit  Subjective:  Patient ID: Michaela Crane, female    DOB: [birth year]  Age: 32 y.o. MRN: [id number]  CC:  Chief Complaint  Patient presents with   New Patient (Initial Visit)   Mass    Pt c/o a lump on the right side neck, Noticed 2 weeks ago.Not painful.   Migraine    Pt c/o migraines     HPI Michaela Crane presents for establishing care today.  Neck lump: behind her right ear, reports having for a while, states it gets larger and then goes down in size, never tender when palpated. Denies any recent illnesses, no ear or sinus sx, no scalp abrasions or scratches that she can remember.  Migraine:  reports having for a long time, followed by a headache specialist, takes Imitrex for acute, and Topamax qd for prevention, but states she hasn't been taking the Topamax recently and thinks that is why she is having more episodes. Reports it takes away her appetite. She weighed less than 200lbs and since not taking med she has regained 37lbs. Also reports it causes her toes to tingle.  Assessment & Plan:   Problem List Items Addressed This Visit       Cardiovascular and Mediastinum   Migraine without aura and without status migrainosus, not intractable - Primary    chronic followed by a specialist takes Imitrex for acute & Topamax 100mg  qd for prevention, though has not taken recently & has had more episodes as well as weight gain advised to restart, may try 1/2 pill qd to start, if not helping migraines, increase to 1/2 pill twice a day & see if reduces SE f/u with migraine provider        Other   Morbid obesity ([place])    chronic had been taking Topamax which was helping her lose weight as well as prevent her migraines, weight dropped down to 194, but then has regained as she stopped taking the Topamax Wt. Loss strategies reviewed including portion control, less carbs including sweets, eating most of calories earlier in day, drinking 64oz  water qd, and establishing daily exercise routine.       Other Visit Diagnoses     Lymphadenopathy, postauricular    - approx. 1cm in diameter, non-tender, pt denies any sx, no injuries to ear, earlobes, scalp or neck. Advised to monitor, if becomes larger or painful, let me know.    Subjective:    Outpatient Medications Prior to Visit  Medication Sig Dispense Refill   naproxen (NAPROSYN) 500 MG tablet TAKE 1 TABLET(500 MG) BY MOUTH TWICE DAILY WITH A MEAL AS NEEDED FOR PAIN 60 tablet 2   norelgestromin-ethinyl estradiol (XULANE) 150-35 MCG/24HR transdermal patch Place 1 patch onto the skin once a week. 3 patch 12   SUMAtriptan (IMITREX) 100 MG tablet Take 1 tablet (100 mg total) by mouth once as needed for up to 1 dose for migraine. May repeat in 2 hours if headache persists or recurs. 9 tablet 1   topiramate (TOPAMAX) 100 MG tablet Take 1 tablet (100 mg total) by mouth daily. 30 tablet 11   No facility-administered medications prior to visit.   Past Medical History:  Diagnosis Date   Anemia    Anxiety    Depression    GERD (gastroesophageal reflux disease)    History of kidney stones    Hypertension 02/16/2004   Migraines    Obesity 01/2019   Past Surgical History:  Procedure Laterality Date  CYSTOSCOPY WITH RETROGRADE PYELOGRAM, URETEROSCOPY AND STENT PLACEMENT Bilateral 06/25/2020   Procedure: CYSTOSCOPY WITH BILATERAL  RETROGRADE PYELOGRAM,right  URETEROSCOPY  basket extraction stone AND left diagnostic ureteroscopy      and bilateral STENT PLACEMENT;  Surgeon: Alexis Frock, MD;  Location: [facility];  Service: Urology;  Laterality: Bilateral;    Objective:   Today's Vitals: BP 110/68 (BP Location: Left Arm, Patient Position: Sitting, Cuff Size: Large)   Pulse 79   Temp (!) 97.5 F (36.4 C) (Temporal)   Ht 5\' 3"  (1.6 m)   Wt 237 lb 6 oz (107.7 kg)   LMP 08/05/2022 (Exact Date) Comment: PATCH  SpO2 97%   BMI 42.05 kg/m   Physical Exam Vitals and nursing note  reviewed.  Constitutional:      Appearance: Normal appearance. She is morbidly obese.  Cardiovascular:     Rate and Rhythm: Normal rate and regular rhythm.  Pulmonary:     Effort: Pulmonary effort is normal.     Breath sounds: Normal breath sounds.  Musculoskeletal:        General: Normal range of motion.  Lymphadenopathy:     Head:     Right side of head: Posterior auricular (approx 1cm in diameter, non-tender) adenopathy present.     Cervical:     Right cervical: Posterior cervical adenopathy: approx 1cm in diameter, non-tender.  Skin:    General: Skin is warm and dry.  Neurological:     Mental Status: She is alert.  Psychiatric:        Mood and Affect: Mood normal.        Behavior: Behavior normal.    Jeanie Sewer, NP

## 2022-08-08 NOTE — Patient Instructions (Addendum)
Welcome to [facility], It was a pleasure meeting you today!   You can schedule a physical with fasting labs at your convenience.  Happy new year!    PLEASE NOTE: If you had any LAB tests please let us know if you have not heard back within a few days. You may see your results on MyChart before we have a chance to review them but we will give you a call once they are reviewed by [place]. If we ordered any REFERRALS today, please let us know if you have not heard from their office within the next week.  Let us know through MyChart if you are needing REFILLS, or have your pharmacy send [place] the request. You can also use MyChart to communicate with me or any office staff.  Please try these tips to maintain a healthy lifestyle: It is important that you exercise regularly at least 30 minutes 5 times a week. Think about what you will eat, plan ahead. Choose whole foods, & think  "clean, green, fresh or frozen" over canned, processed or packaged foods which are more sugary, salty, and fatty. 70 to 75% of food eaten should be fresh vegetables and protein. 2-3  meals daily with healthy snacks between meals, but must be whole fruit, protein or vegetables. Aim to eat over a 10 hour period when you are active, for example, 7am to 5pm, and then STOP after your last meal of the day, drinking only water.  Shorter eating windows, 6-8 hours, are showing benefits in heart disease and blood sugar regulation. Drink water every day! Shoot for 64 ounces daily = 8 cups, no other drink is as healthy! Fruit juice is best enjoyed in a healthy way, by EATING the fruit.

## 2022-08-08 NOTE — Assessment & Plan Note (Signed)
chronic followed by a specialist takes Imitrex for acute & Topamax 100mg  qd for prevention, though has not taken recently & has had more episodes as well as weight gain advised to restart, may try 1/2 pill qd to start, if not helping migraines, increase to 1/2 pill twice a day & see if reduces SE f/u with migraine provider

## 2022-08-08 NOTE — Assessment & Plan Note (Signed)
chronic had been taking Topamax which was helping her lose weight as well as prevent her migraines, weight dropped down to 194, but then has regained as she stopped taking the Topamax Wt. Loss strategies reviewed including portion control, less carbs including sweets, eating most of calories earlier in day, drinking 64oz water qd, and establishing daily exercise routine.

## 2023-01-02 ENCOUNTER — Other Ambulatory Visit: Payer: Self-pay | Admitting: Physician Assistant

## 2023-01-07 ENCOUNTER — Telehealth: Payer: Self-pay

## 2023-01-07 ENCOUNTER — Other Ambulatory Visit: Payer: Self-pay

## 2023-01-07 MED ORDER — NAPROXEN 500 MG PO TABS: ORAL_TABLET | ORAL | 2 refills | Status: DC

## 2023-01-07 NOTE — Progress Notes (Signed)
Refill for Naproxen sent to per pt request.

## 2023-01-07 NOTE — Telephone Encounter (Signed)
Attempted to reach patient regarding message left with answering service Left voicemail for patient to call office or send mychart message with details regarding appointment needs.   

## 2023-02-01 ENCOUNTER — Ambulatory Visit (INDEPENDENT_AMBULATORY_CARE_PROVIDER_SITE_OTHER): Payer: BC Managed Care – PPO | Admitting: Physician Assistant

## 2023-02-01 ENCOUNTER — Encounter: Payer: Self-pay | Admitting: Physician Assistant

## 2023-02-01 VITALS — BP 136/79 | HR 74 | Wt 246.0 lb

## 2023-02-01 DIAGNOSIS — G43009 Migraine without aura, not intractable, without status migrainosus: Secondary | ICD-10-CM | POA: Diagnosis not present

## 2023-02-01 MED ORDER — SUMATRIPTAN SUCCINATE 100 MG PO TABS: 100.0000 mg | ORAL_TABLET | Freq: Once | ORAL | 11 refills | Status: DC | PRN

## 2023-02-01 MED ORDER — BACLOFEN 10 MG PO TABS: 10.0000 mg | ORAL_TABLET | ORAL | 3 refills | Status: AC | PRN

## 2023-02-01 MED ORDER — TOPIRAMATE 25 MG PO TABS: 75.0000 mg | ORAL_TABLET | Freq: Every day | ORAL | 11 refills | Status: DC

## 2023-02-01 NOTE — Progress Notes (Signed)
Patient here for Headache Management.  Pt notes having migraine this week felt it was more in the back of her neck and radiates to the front Pt does not like side effects of Topamax not able to take daily for this reason.  Reports Naproxen will help sometimes then sometimes it may not  Pt unsure if she needs a lower dose of Topamax.   History:  Michaela Crane is a 32 y.o. G1P1001 who presents to clinic today for headache followup.  She feels the topiramate has been helpful but is causing side effects at current dose with only 100mg  tabs available.  She has discontinued and restarted a few times. She always feels the headaches worsen after some time off the topiramate.  She wants to return to lower dose to be able to be more flexible.  She also notes her weight has increased with the discontinuation of topiramate. Sumatriptan was useful when she awoke in the am with headache.  She has not been having them as often.  Naproxen is sufficient for many of her headaches.  She is feeling more neck pain at the start of a headache now and notes a burning down her neck.  She is considering adding massage. She is working from home and doesn't always use good body mechanics.    Number of days in the last 4 weeks with:  Severe headache: 5 Moderate headache: 5 Mild headache: 6  No headache: 7   Past Medical History:  Diagnosis Date   Anemia    Anxiety    Depression    GERD (gastroesophageal reflux disease)    History of kidney stones    Hypertension 02/16/2004   Migraines    Obesity 01/2019    Social History   Socioeconomic History   Marital status: Single    Spouse name: Not on file   Number of children: Not on file   Years of education: Not on file   Highest education level: Not on file  Occupational History   Not on file  Tobacco Use   Smoking status: Never   Smokeless tobacco: Never  Vaping Use   Vaping Use: Never used  Substance and Sexual Activity   Alcohol use: Yes     Alcohol/week: 1.0 standard drink of alcohol    Types: 1 Glasses of wine per week    Comment: socially   Drug use: No   Sexual activity: Yes    Birth control/protection: Patch  Other Topics Concern   Not on file  Social History Narrative   Not on file   Social Determinants of Health   Financial Resource Strain: Medium Risk (03/29/2022)   Overall Financial Resource Strain (CARDIA)    Difficulty of Paying Living Expenses: Somewhat hard  Food Insecurity: No Food Insecurity (03/29/2022)   Hunger Vital Sign    Worried About Running Out of Food in the Last Year: Never true    Ran Out of Food in the Last Year: Never true  Transportation Needs: No Transportation Needs (03/29/2022)   PRAPARE - Administrator, Civil Service (Medical): No    Lack of Transportation (Non-Medical): No  Physical Activity: Insufficiently Active (03/29/2022)   Exercise Vital Sign    Days of Exercise per Week: 2 days    Minutes of Exercise per Session: 30 min  Stress: No Stress Concern Present (03/29/2022)   [employer] of Occupational Health - Occupational Stress Questionnaire    Feeling of Stress : Only a little  Social  Connections: Moderately Isolated (03/29/2022)   Social Connection and Isolation Panel [NHANES]    Frequency of Communication with Friends and Family: More than three times a week    Frequency of Social Gatherings with Friends and Family: Once a week    Attends Religious Services: Never    Database administrator or Organizations: No    Attends Banker Meetings: Never    Marital Status: Married  Catering manager Violence: Not At Risk (03/29/2022)   Humiliation, Afraid, Rape, and Kick questionnaire    Fear of Current or Ex-Partner: No    Emotionally Abused: No    Physically Abused: No    Sexually Abused: No    Family History  Problem Relation Age of Onset   Healthy Mother    Obesity Mother    Healthy Father    Hypertension Father    Obesity Father    Stroke  Maternal Grandmother     No Known Allergies  Current Outpatient Medications on File Prior to Visit  Medication Sig Dispense Refill   naproxen (NAPROSYN) 500 MG tablet TAKE 1 TABLET(500 MG) BY MOUTH TWICE DAILY WITH A MEAL AS NEEDED FOR PAIN 60 tablet 2   norelgestromin-ethinyl estradiol (XULANE) 150-35 MCG/24HR transdermal patch Place 1 patch onto the skin once a week. 3 patch 12   topiramate (TOPAMAX) 100 MG tablet Take 1 tablet (100 mg total) by mouth daily. 30 tablet 11   naproxen (NAPROSYN) 500 MG tablet TAKE 1 TABLET(500 MG) BY MOUTH TWICE DAILY WITH A MEAL AS NEEDED FOR PAIN 60 tablet 2   SUMAtriptan (IMITREX) 100 MG tablet Take 1 tablet (100 mg total) by mouth once as needed for up to 1 dose for migraine. May repeat in 2 hours if headache persists or recurs. (Patient not taking: Reported on 02/01/2023) 9 tablet 1   No current facility-administered medications on file prior to visit.     Review of Systems:  All pertinent positive/negative included in HPI, all other review of systems are negative   Objective:  Physical Exam BP 136/79   Pulse 74   Wt 246 lb (111.6 kg)   LMP 01/15/2023 (Approximate)   BMI 43.58 kg/m  CONSTITUTIONAL: Well-developed, well-nourished female in no acute distress.  EYES: EOM intact ENT: Normocephalic CARDIOVASCULAR: Regular rate  MUSCULOSKELETAL: Normal ROM SKIN: Warm, dry without erythema  NEUROLOGICAL: Alert, oriented, CN II-XII grossly intact, Appropriate balance  PSYCH: Normal behavior, mood   Assessment & Plan:  Assessment: 1. Migraine without aura and without status migrainosus, not intractable      Plan: Restart topamax - 1 tab daily x 1 week, then increase to 2 tabs daily for a week, if tolerated: increase to 3 tabs daily. Sumatriptan - use early in headache attack.  May have improved efficacy with addition of naprosyn. Work on Estate manager/land agent.  Set alarm to remind for regular stretching of neck muscles.  Begin Baclofen with muscle  spasm or neck pain.  Try initially in evening to assess for sedation.   Massage as able to afford Follow-up in 12 months or sooner PRN 45 minutes of direct face to face contact with patient this encounter.  Bertram Denver, PA-C 02/01/2023 8:18 AM

## 2023-05-02 ENCOUNTER — Encounter: Payer: Self-pay | Admitting: *Deleted

## 2023-05-02 ENCOUNTER — Other Ambulatory Visit: Payer: Self-pay | Admitting: *Deleted

## 2023-05-02 DIAGNOSIS — Z30016 Encounter for initial prescription of transdermal patch hormonal contraceptive device: Secondary | ICD-10-CM

## 2023-05-06 MED ORDER — NORELGESTROMIN-ETH ESTRADIOL 150-35 MCG/24HR TD PTWK: 1.0000 | MEDICATED_PATCH | TRANSDERMAL | 12 refills | Status: DC

## 2023-12-16 ENCOUNTER — Ambulatory Visit: Admitting: Obstetrics & Gynecology

## 2023-12-18 ENCOUNTER — Encounter: Payer: Self-pay | Admitting: Obstetrics & Gynecology

## 2023-12-18 ENCOUNTER — Ambulatory Visit: Admitting: Obstetrics & Gynecology

## 2023-12-18 ENCOUNTER — Other Ambulatory Visit (HOSPITAL_COMMUNITY)
Admission: RE | Admit: 2023-12-18 | Discharge: 2023-12-18 | Disposition: A | Discharge status: Home / Self Care | Source: Ambulatory Visit | Attending: Obstetrics & Gynecology | Admitting: Obstetrics & Gynecology

## 2023-12-18 VITALS — BP 128/85 | HR 80 | Ht 63.0 in | Wt 230.2 lb

## 2023-12-18 DIAGNOSIS — Z01419 Encounter for gynecological examination (general) (routine) without abnormal findings: Secondary | ICD-10-CM | POA: Diagnosis not present

## 2023-12-18 DIAGNOSIS — Z113 Encounter for screening for infections with a predominantly sexual mode of transmission: Secondary | ICD-10-CM | POA: Diagnosis not present

## 2023-12-18 DIAGNOSIS — N9089 Other specified noninflammatory disorders of vulva and perineum: Secondary | ICD-10-CM

## 2023-12-18 DIAGNOSIS — Z30016 Encounter for initial prescription of transdermal patch hormonal contraceptive device: Secondary | ICD-10-CM

## 2023-12-18 DIAGNOSIS — N760 Acute vaginitis: Secondary | ICD-10-CM | POA: Diagnosis not present

## 2023-12-18 DIAGNOSIS — Z1151 Encounter for screening for human papillomavirus (HPV): Secondary | ICD-10-CM | POA: Diagnosis not present

## 2023-12-18 MED ORDER — NORELGESTROMIN-ETH ESTRADIOL 150-35 MCG/24HR TD PTWK: 1.0000 | MEDICATED_PATCH | TRANSDERMAL | 4 refills | Status: AC

## 2023-12-18 MED ORDER — VALACYCLOVIR HCL 500 MG PO TABS: 500.0000 mg | ORAL_TABLET | Freq: Two times a day (BID) | ORAL | 0 refills | Status: AC

## 2023-12-18 MED ORDER — METRONIDAZOLE 0.75 % EX GEL: 1.0000 | Freq: Every evening | CUTANEOUS | 1 refills | Status: AC

## 2023-12-18 NOTE — Progress Notes (Signed)
 WELL-WOMAN EXAMINATION Patient name: Michaela Crane MRN [id number]  Date of birth: 19-Dec-1990 Chief Complaint:   Annual Exam  History of Present Illness:   Michaela Crane is a 33 y.o. G9P1001 female being seen today for a routine well-woman exam.   Menses are regular each month and typically last for about 3 days.  She is using the patch though recently due to separation she is not as sexually active and so has not been using this regularly.  However she does wish to continue with this form of contraception  - h/o recurrent BV- using metrogel  when needed Doing a little bit better using organic tampons and only on occasion will need to use the MetroGel   []  refill Metrogel   Additionally she notes a point of irritation on her right vulva that is mostly itching.  Denies discharge or drainage from the area denies significant pain.  She may be recall a remote history of a prior similar issue back when she was being seen in the health department.  Cannot recall if she was previously diagnosed with HSV or genital warts   Patient's last menstrual period was 12/06/2023 (exact date). Denies issues with her menses The current method of family planning is xulane patch   Last pap due today.  Last mammogram: NA. Last colonoscopy: NA     12/18/2023    8:48 AM 03/29/2022    9:18 AM 01/08/2019    9:06 AM 12/06/2015    6:07 PM 09/29/2015    6:24 PM  Depression screen PHQ 2/9  Decreased Interest 1 0 2 0 0  Down, Depressed, Hopeless 1 1 2  0 0  PHQ - 2 Score 2 1 4  0 0  Altered sleeping 0 1     Tired, decreased energy 1 1     Change in appetite 1 1     Feeling bad or failure about yourself  1 1     Trouble concentrating 1 1     Moving slowly or fidgety/restless 0 0     Suicidal thoughts 0 0     PHQ-9 Score 6 6         Review of Systems:   Pertinent items are noted in HPI Denies any headaches, blurred vision, fatigue, shortness of breath, chest pain, abdominal pain, bowel  movements, urination, or intercourse unless otherwise stated above.  Pertinent History Reviewed:  Reviewed past medical,surgical, social and family history.  Reviewed problem list, medications and allergies. Physical Assessment:   Vitals:   12/18/23 0842  BP: 128/85  Pulse: 80  Weight: 230 lb 3.2 oz (104.4 kg)  Height: 5\' 3"  (1.6 m)  Body mass index is 40.78 kg/m.        Physical Examination:   General appearance - well appearing, and in no distress  Mental status - alert, oriented to person, place, and time  Psych:  She has a normal mood and affect  Skin - warm and dry, normal color, no suspicious lesions noted  Chest - effort normal, all lung fields clear to auscultation bilaterally  Heart - normal rate and regular rhythm  Neck:  midline trachea, no thyromegaly or nodules  Breasts - breasts appear normal, no suspicious masses, no skin or nipple changes or  axillary nodes  Abdomen - soft, nontender, nondistended, no masses or organomegaly  Pelvic - VULVA: normal appearing vulva with no masses, tenderness or lesions  VAGINA: normal appearing vagina with normal color and discharge, no lesions  CERVIX: normal appearing cervix  without discharge or lesions, no CMT  Thin prep pap is done with HR HPV cotesting  UTERUS: uterus is felt to be normal size, shape, consistency and nontender   ADNEXA: No adnexal masses or tenderness noted.  Extremities:  No swelling or varicosities noted  Chaperone: Wendell Halt     Assessment & Plan:  1) Well-Woman Exam -pap collected, reviewed ASCCP guidelines -declined STI screening  2) Vulvar lesion -DDX: HSV vs vulvar dermatitis -swab and blood work today -valtrex sent in  3) Contraceptive management -continue with patch  4) Recurrent BV -metrogel  prn, Rx sent in  Orders Placed This Encounter  Procedures   Herpes simplex virus culture   HSV 1 and 2 Ab, IgG    Meds:  Meds ordered this encounter  Medications   valACYclovir  (VALTREX) 500 MG tablet    Sig: Take 1 tablet (500 mg total) by mouth 2 (two) times daily for 3 days.    Dispense:  6 tablet    Refill:  0   norelgestromin -ethinyl estradiol  (XULANE) 150-35 MCG/24HR transdermal patch    Sig: Place 1 patch onto the skin once a week.    Dispense:  12 patch    Refill:  4   metroNIDAZOLE  (METROGEL ) 0.75 % gel    Sig: Apply 1 Application topically at bedtime for 5 days.    Dispense:  45 g    Refill:  1    Follow-up: Return in about 1 year (around 12/17/2024) for Annual.   Haruto Demaria, DO Attending Obstetrician & Gynecologist, [facility] for [facility], [facility]

## 2023-12-19 ENCOUNTER — Ambulatory Visit: Payer: Self-pay | Admitting: Obstetrics & Gynecology

## 2023-12-19 LAB — CYTOLOGY - PAP
Comment: NEGATIVE
Diagnosis: NEGATIVE
High risk HPV: NEGATIVE

## 2023-12-20 LAB — HSV 1 AND 2 AB, IGG
HSV 1 Glycoprotein G Ab, IgG: NONREACTIVE
HSV 2 IgG, Type Spec: REACTIVE — AB

## 2023-12-21 LAB — HERPES SIMPLEX VIRUS CULTURE

## 2024-01-17 ENCOUNTER — Other Ambulatory Visit: Payer: Self-pay | Admitting: Obstetrics & Gynecology

## 2024-01-17 DIAGNOSIS — N9089 Other specified noninflammatory disorders of vulva and perineum: Secondary | ICD-10-CM

## 2024-01-17 MED ORDER — VALACYCLOVIR HCL 500 MG PO TABS: ORAL_TABLET | ORAL | 3 refills | Status: AC

## 2024-01-17 NOTE — Progress Notes (Signed)
 Rx for valtrex  refill -Discussed different of suppression therapy versus taking medication as needed -for now to take just as needed  Jyden Kromer, DO Attending Obstetrician & Gynecologist, [facility] for [employer], [facility]

## 2024-01-31 ENCOUNTER — Encounter: Admitting: Physician Assistant

## 2024-04-10 ENCOUNTER — Encounter: Payer: Self-pay | Admitting: Physician Assistant

## 2024-04-10 ENCOUNTER — Ambulatory Visit (INDEPENDENT_AMBULATORY_CARE_PROVIDER_SITE_OTHER): Admitting: Physician Assistant

## 2024-04-10 VITALS — BP 125/77 | HR 69 | Wt 240.0 lb

## 2024-04-10 DIAGNOSIS — G43009 Migraine without aura, not intractable, without status migrainosus: Secondary | ICD-10-CM

## 2024-04-10 MED ORDER — NAPROXEN 500 MG PO TABS: ORAL_TABLET | ORAL | 2 refills | Status: DC

## 2024-04-10 MED ORDER — SUMATRIPTAN SUCCINATE 100 MG PO TABS: 100.0000 mg | ORAL_TABLET | Freq: Once | ORAL | 11 refills | Status: AC | PRN

## 2024-04-10 MED ORDER — EMGALITY 120 MG/ML ~~LOC~~ SOAJ: SUBCUTANEOUS | 11 refills | Status: DC

## 2024-04-10 NOTE — Progress Notes (Signed)
 CC: Follow up headaches  History:  Michaela Crane is a 33 y.o. G1P1001 who presents to clinic today for yearly headache visit.   Kidney stones have been stable.  She is not great at using medicines routinely and discontinued the topamax .  She has seen weight gain which she attributes to this.  She is having a fair number of headaches, most improved with naprosyn .  She thinks some are related to hormones.  She has not been consistent with birth control but is not needing that currently for contraception. She is interested in migraine prevention.  Topiramate  caused side effects (tingling, forgetfulness, kidney stones).  Amitriptyline caused weight gain/dry mouth.    Number of days in the last 4 weeks with:  Severe headache: 0    Moderate headache: 5 Mild headache: 13  No headache: 10   Past Medical History:  Diagnosis Date   Anemia    Anxiety    Depression    GERD (gastroesophageal reflux disease)    History of kidney stones    Hypertension 02/16/2004   Migraines    Obesity 01/2019    Social History   Socioeconomic History   Marital status: Single    Spouse name: Not on file   Number of children: Not on file   Years of education: Not on file   Highest education level: Not on file  Occupational History   Not on file  Tobacco Use   Smoking status: Never   Smokeless tobacco: Never  Vaping Use   Vaping status: Never Used  Substance and Sexual Activity   Alcohol use: Yes    Alcohol/week: 1.0 standard drink of alcohol    Types: 1 Glasses of wine per week    Comment: socially   Drug use: No   Sexual activity: Yes    Birth control/protection: Patch  Other Topics Concern   Not on file  Social History Narrative   Not on file   Social Drivers of Health   Financial Resource Strain: Low Risk  (12/18/2023)   Overall Financial Resource Strain (CARDIA)    Difficulty of Paying Living Expenses: Not hard at all  Food Insecurity: No Food Insecurity (12/18/2023)   Hunger Vital  Sign    Worried About Running Out of Food in the Last Year: Never true    Ran Out of Food in the Last Year: Never true  Transportation Needs: No Transportation Needs (12/18/2023)   PRAPARE - Administrator, Civil Service (Medical): No    Lack of Transportation (Non-Medical): No  Physical Activity: Insufficiently Active (12/18/2023)   Exercise Vital Sign    Days of Exercise per Week: 1 day    Minutes of Exercise per Session: 20 min  Stress: No Stress Concern Present (12/18/2023)   [employer] of Occupational Health - Occupational Stress Questionnaire    Feeling of Stress : Only a little  Social Connections: Moderately Isolated (12/18/2023)   Social Connection and Isolation Panel    Frequency of Communication with Friends and Family: More than three times a week    Frequency of Social Gatherings with Friends and Family: Three times a week    Attends Religious Services: 1 to 4 times per year    Active Member of Clubs or Organizations: No    Attends Banker Meetings: Never    Marital Status: Separated  Intimate Partner Violence: Not At Risk (12/18/2023)   Humiliation, Afraid, Rape, and Kick questionnaire    Fear of Current or Ex-Partner:  No    Emotionally Abused: No    Physically Abused: No    Sexually Abused: No    Family History  Problem Relation Age of Onset   Healthy Mother    Obesity Mother    Healthy Father    Hypertension Father    Obesity Father    Stroke Maternal Grandmother     No Known Allergies  Current Outpatient Medications on File Prior to Visit  Medication Sig Dispense Refill   topiramate  (TOPAMAX ) 100 MG tablet Take 1 tablet (100 mg total) by mouth daily. 30 tablet 11   valACYclovir  (VALTREX ) 500 MG tablet Take twice daily for 3-5 days when needed 30 tablet 3   naproxen  (NAPROSYN ) 500 MG tablet TAKE 1 TABLET(500 MG) BY MOUTH TWICE DAILY WITH A MEAL AS NEEDED FOR PAIN (Patient not taking: Reported on 04/10/2024) 60 tablet 2   naproxen   (NAPROSYN ) 500 MG tablet TAKE 1 TABLET(500 MG) BY MOUTH TWICE DAILY WITH A MEAL AS NEEDED FOR PAIN 60 tablet 2   norelgestromin -ethinyl estradiol  (XULANE) 150-35 MCG/24HR transdermal patch Place 1 patch onto the skin once a week. 12 patch 4   SUMAtriptan  (IMITREX ) 100 MG tablet Take 1 tablet (100 mg total) by mouth once as needed for up to 1 dose for migraine. May repeat in 2 hours if headache persists or recurs. (Patient not taking: Reported on 04/10/2024) 9 tablet 11   No current facility-administered medications on file prior to visit.     Review of Systems:  All pertinent positive/negative included in HPI, all other review of systems are negative   Objective:  Physical Exam BP 125/77   Pulse 69   Wt 240 lb (108.9 kg)   BMI 42.51 kg/m  CONSTITUTIONAL: Well-developed, well-nourished female in no acute distress.  EYES: EOM intact ENT: Normocephalic CARDIOVASCULAR: Regular rate RESPIRATORY: Normal rate.  MUSCULOSKELETAL: Normal ROM SKIN: Warm, dry without erythema  NEUROLOGICAL: Alert, oriented, CN II-XII grossly intact, Appropriate balance PSYCH: Normal behavior, mood   Assessment & Plan:  Assessment: 1. Migraine without aura and without status migrainosus, not intractable      Plan: Start emgality  for prevention -use 2 injections for first dose and then 1 every 30 days.  Demo utilized for pt education.  Video available at [email or web address] Continue sumatriptan /naprosyn  for abortive therapy FMLA paperwork will be sent Follow-up in 12 months or sooner PRN 44 minutes face to face time spent this encounter Nance Gretta Darice FORBES, PA-C 04/10/2024 10:49 AM

## 2024-04-15 ENCOUNTER — Other Ambulatory Visit: Payer: Self-pay | Admitting: *Deleted

## 2024-04-15 DIAGNOSIS — G43009 Migraine without aura, not intractable, without status migrainosus: Secondary | ICD-10-CM

## 2024-04-15 MED ORDER — EMGALITY 120 MG/ML ~~LOC~~ SOAJ: SUBCUTANEOUS | 11 refills | Status: AC

## 2024-04-15 NOTE — Progress Notes (Signed)
 Needed a DX associated with and RX, resent

## 2024-05-25 ENCOUNTER — Telehealth: Payer: Self-pay | Admitting: *Deleted

## 2024-05-25 NOTE — Telephone Encounter (Signed)
 Emgality  approved via Cover My Meds   Approved. . Authorization Expiration Date: August 13, 2024.

## 2024-08-16 ENCOUNTER — Encounter: Payer: Self-pay | Admitting: Obstetrics & Gynecology

## 2024-08-17 ENCOUNTER — Other Ambulatory Visit: Payer: Self-pay | Admitting: Physician Assistant

## 2024-08-17 ENCOUNTER — Other Ambulatory Visit: Payer: Self-pay | Admitting: Adult Health

## 2024-08-18 ENCOUNTER — Other Ambulatory Visit: Payer: Self-pay | Admitting: Adult Health

## 2024-08-18 MED ORDER — METRONIDAZOLE 0.75 % VA GEL: 1.0000 | Freq: Every day | VAGINAL | 0 refills | Status: AC

## 2024-08-18 NOTE — Progress Notes (Signed)
Rx metrogel
